# Patient Record
Sex: Male | Born: 1935 | Race: White | Hispanic: No | Marital: Married | Smoking: Former smoker
Health system: Southern US, Community
[De-identification: ages and names within clinical notes are randomized; demographics above are authoritative.]

## PROBLEM LIST (undated history)

## (undated) DIAGNOSIS — R55 Syncope and collapse: Secondary | ICD-10-CM

## (undated) DIAGNOSIS — K219 Gastro-esophageal reflux disease without esophagitis: Secondary | ICD-10-CM

## (undated) DIAGNOSIS — N189 Chronic kidney disease, unspecified: Secondary | ICD-10-CM

## (undated) DIAGNOSIS — E119 Type 2 diabetes mellitus without complications: Secondary | ICD-10-CM

## (undated) DIAGNOSIS — I739 Peripheral vascular disease, unspecified: Secondary | ICD-10-CM

## (undated) DIAGNOSIS — R001 Bradycardia, unspecified: Secondary | ICD-10-CM

## (undated) DIAGNOSIS — G4733 Obstructive sleep apnea (adult) (pediatric): Secondary | ICD-10-CM

## (undated) DIAGNOSIS — I10 Essential (primary) hypertension: Secondary | ICD-10-CM

## (undated) DIAGNOSIS — I872 Venous insufficiency (chronic) (peripheral): Secondary | ICD-10-CM

## (undated) DIAGNOSIS — E785 Hyperlipidemia, unspecified: Secondary | ICD-10-CM

## (undated) DIAGNOSIS — D759 Disease of blood and blood-forming organs, unspecified: Secondary | ICD-10-CM

## (undated) HISTORY — DX: Peripheral vascular disease, unspecified: I73.9

## (undated) HISTORY — DX: Venous insufficiency (chronic) (peripheral): I87.2

## (undated) HISTORY — DX: Type 2 diabetes mellitus without complications: E11.9

## (undated) HISTORY — PX: SEPTOPLASTY: SUR1290

---
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
## 2001-01-08 ENCOUNTER — Encounter: Admission: RE | Admit: 2001-01-08 | Discharge: 2001-04-08 | Payer: Self-pay | Admitting: Family Medicine

## 2001-03-06 ENCOUNTER — Ambulatory Visit (HOSPITAL_COMMUNITY): Admission: RE | Admit: 2001-03-06 | Discharge: 2001-03-06 | Payer: Self-pay | Admitting: *Deleted

## 2004-09-04 ENCOUNTER — Encounter: Admission: RE | Admit: 2004-09-04 | Discharge: 2004-12-03 | Payer: Self-pay | Admitting: Family Medicine

## 2010-09-18 ENCOUNTER — Ambulatory Visit: Payer: Self-pay | Admitting: Hematology & Oncology

## 2010-10-10 ENCOUNTER — Other Ambulatory Visit: Payer: Self-pay | Admitting: Hematology & Oncology

## 2010-10-10 ENCOUNTER — Ambulatory Visit (HOSPITAL_BASED_OUTPATIENT_CLINIC_OR_DEPARTMENT_OTHER): Payer: Medicare Other | Admitting: Hematology & Oncology

## 2010-10-10 DIAGNOSIS — D6949 Other primary thrombocytopenia: Secondary | ICD-10-CM

## 2010-10-10 LAB — CBC WITH DIFFERENTIAL (CANCER CENTER ONLY)
BASO#: 0 10*3/uL (ref 0.0–0.2)
BASO%: 0.3 % (ref 0.0–2.0)
EOS%: 6.4 % (ref 0.0–7.0)
HCT: 41.6 % (ref 38.7–49.9)
HGB: 14.5 g/dL (ref 13.0–17.1)
LYMPH#: 1.1 10*3/uL (ref 0.9–3.3)
MCH: 30.1 pg (ref 28.0–33.4)
MCHC: 34.9 g/dL (ref 32.0–35.9)
MONO%: 6.9 % (ref 0.0–13.0)
NEUT#: 4.3 10*3/uL (ref 1.5–6.5)
NEUT%: 68.5 % (ref 40.0–80.0)
RDW: 13.2 % (ref 11.1–15.7)

## 2010-10-10 LAB — CHCC SATELLITE - SMEAR

## 2010-10-12 LAB — PROTEIN ELECTROPHORESIS, SERUM
Albumin ELP: 58.9 % (ref 55.8–66.1)
Alpha-1-Globulin: 4.2 % (ref 2.9–4.9)
Alpha-2-Globulin: 12.4 % — ABNORMAL HIGH (ref 7.1–11.8)
Beta 2: 5.3 % (ref 3.2–6.5)
Beta Globulin: 6.4 % (ref 4.7–7.2)
Total Protein, Serum Electrophoresis: 7.1 g/dL (ref 6.0–8.3)

## 2010-10-12 LAB — LACTATE DEHYDROGENASE: LDH: 182 U/L (ref 94–250)

## 2010-10-12 LAB — RETICULOCYTES (CHCC): Retic Ct Pct: 1 % (ref 0.4–2.3)

## 2011-04-05 ENCOUNTER — Ambulatory Visit (HOSPITAL_BASED_OUTPATIENT_CLINIC_OR_DEPARTMENT_OTHER): Payer: Medicare Other | Admitting: Hematology & Oncology

## 2011-04-05 ENCOUNTER — Other Ambulatory Visit (HOSPITAL_BASED_OUTPATIENT_CLINIC_OR_DEPARTMENT_OTHER): Payer: Medicare Other | Admitting: Lab

## 2011-04-05 VITALS — BP 141/67 | HR 52 | Temp 96.5°F | Ht 70.5 in | Wt 169.0 lb

## 2011-04-05 DIAGNOSIS — D696 Thrombocytopenia, unspecified: Secondary | ICD-10-CM

## 2011-04-05 DIAGNOSIS — D693 Immune thrombocytopenic purpura: Secondary | ICD-10-CM

## 2011-04-05 LAB — CBC WITH DIFFERENTIAL (CANCER CENTER ONLY)
BASO%: 0.5 % (ref 0.0–2.0)
LYMPH%: 25.9 % (ref 14.0–48.0)
MCH: 29.7 pg (ref 28.0–33.4)
MCV: 88 fL (ref 82–98)
MONO#: 0.7 10*3/uL (ref 0.1–0.9)
MONO%: 11.9 % (ref 0.0–13.0)
Platelets: 111 10*3/uL — ABNORMAL LOW (ref 145–400)
RDW: 13.2 % (ref 11.1–15.7)
WBC: 6.1 10*3/uL (ref 4.0–10.0)

## 2011-04-05 LAB — CHCC SATELLITE - SMEAR

## 2011-04-05 NOTE — Progress Notes (Signed)
CC:   Anna Genre. Little, M.D.  DIAGNOSIS:  Thrombocytopenia, chronic, benign.  CURRENT THERAPY:  Observation.  INTERIM HISTORY:  Mr. Venard comes in for followup.  We saw him back in September 2012.  At that point in time, his workup was negative for the thrombocytopenia.  I have not seen him since 2004 in reality.  His platelet count has slowly trending downward.  However, he has been totally asymptomatic.  He has had no problem with bleeding or bruising.  There is no change in bowel or bladder habits.  He has had no weight loss or weight gain. There have been no rashes.  He has had no cough.  PHYSICAL EXAMINATION:  General:  This is an elderly but well-nourished white gentleman in no obvious distress.  Vital signs:  96.5, pulse 62, respiratory rate 18, blood pressure 141/67, weight is 169.  Head and neck:  Exam shows a normocephalic, atraumatic skull.  There are no ocular or oral lesions.  There are no palpable cervical or supraclavicular lymph nodes.  Lungs:  Are clear bilaterally.  Cardiac: Regular rate and rhythm with a normal S1, S2.  There are no murmurs, rubs or bruits.  Abdomen:  Soft with good bowel sounds.  There is no palpable abdominal mass.  There is no palpable hepatosplenomegaly. Back:  No tenderness over the spine, ribs or hips.  Extremities:  Shows no clubbing, cyanosis or edema.  Neurological:  Exam shows no focal neurological deficits.  Skin:  No rashes, ecchymoses or petechiae.  LABORATORY STUDIES:  White cell count is 6.1, hemoglobin 13.6, hematocrit 40.2, platelet count 111.  MCV is 88.  Peripheral smear shows mildly decreased platelets.  Platelets are well- granulated.  He has several large platelets.  There were no nucleated red blood cells.  There were no teardrop cells.  I see no hypersegmented polys.  IMPRESSION:  Mr. Tanguma is a 76 year old gentleman with chronic thrombocytopenia.  His platelet count is really stable over the 6 months.  Will go  ahead and plan to get him back in 6 more months.  I do not see a need for a bone marrow test.  I do not see a need for any change in medications.  He does not need any blood work in-between visits.    ______________________________ Josph Macho, M.D. PRE/MEDQ  D:  04/05/2011  T:  04/05/2011  Job:  [id number]

## 2011-04-05 NOTE — Progress Notes (Signed)
This office note has been dictated.

## 2011-10-18 ENCOUNTER — Ambulatory Visit (HOSPITAL_BASED_OUTPATIENT_CLINIC_OR_DEPARTMENT_OTHER): Payer: Medicare Other | Admitting: Hematology & Oncology

## 2011-10-18 ENCOUNTER — Other Ambulatory Visit (HOSPITAL_BASED_OUTPATIENT_CLINIC_OR_DEPARTMENT_OTHER): Payer: Medicare Other | Admitting: Lab

## 2011-10-18 VITALS — BP 155/59 | HR 52 | Temp 97.6°F | Resp 20 | Ht 70.0 in | Wt 168.0 lb

## 2011-10-18 DIAGNOSIS — D696 Thrombocytopenia, unspecified: Secondary | ICD-10-CM

## 2011-10-18 DIAGNOSIS — D693 Immune thrombocytopenic purpura: Secondary | ICD-10-CM

## 2011-10-18 LAB — CBC WITH DIFFERENTIAL (CANCER CENTER ONLY)
BASO#: 0 10*3/uL (ref 0.0–0.2)
Eosinophils Absolute: 0.4 10*3/uL (ref 0.0–0.5)
HCT: 42.1 % (ref 38.7–49.9)
LYMPH%: 21.8 % (ref 14.0–48.0)
MCV: 88 fL (ref 82–98)
MONO#: 0.5 10*3/uL (ref 0.1–0.9)
NEUT%: 64.6 % (ref 40.0–80.0)
RBC: 4.8 10*6/uL (ref 4.20–5.70)
RDW: 13.2 % (ref 11.1–15.7)
WBC: 6.8 10*3/uL (ref 4.0–10.0)

## 2011-10-18 NOTE — Progress Notes (Signed)
This office note has been dictated.

## 2011-10-18 NOTE — Patient Instructions (Signed)
Call if bleeding

## 2011-10-18 NOTE — Progress Notes (Signed)
CC:   Anna Genre. Little, M.D.  DIAGNOSIS:  Chronic thrombocytopenia, benign.  CURRENT THERAPY:  Observation.  INTERIM HISTORY:  Mr. Mcgrane comes in for followup.  He is doing quite well.  We last saw him back in March.  Since then, he has had no problems at all.  He and his wife just got back from the [employer]. They had a camper they brought with them.  Mr. Philip has not had any problems with bleeding or bruising.  He has had no weight loss or weight gain.  He has had no change in his medications.  He has had no leg swelling.  There have been no rashes.  When we last saw him, his platelet count is 111,000.  PHYSICAL EXAMINATION:  This is an elderly but well-nourished white gentleman in no obvious distress.  Vital signs: 97.6, pulse 52, respiratory rate 20, blood pressure 155/59.  Weight is 168.  Head and neck:  Normocephalic, atraumatic skull.  There are no ocular or oral lesions.  There are no palpable cervical or supraclavicular lymph nodes. Lungs:  Clear bilaterally.  Cardiac:  Regular rate and rhythm with a normal S1, S2.  There are no murmurs, rubs or bruits.  Abdomen:  Soft with good bowel sounds.  There is no palpable abdominal mass.  There is no palpable hepatosplenomegaly  Back:  No tenderness over the spine, ribs, or hips.  Extremities:  No clubbing, cyanosis or edema. Neurological:  No focal neurological deficits.  LABORATORY STUDIES:  White cell count 6.8, hemoglobin 14.2, hematocrit 42.1, platelet count 135.  IMPRESSION:  Mr. Lanius is a 76 year old gentleman with chronic thrombocytopenia.  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  I have been seeing him on and off since 2004.  His platelet count was too low at that point in time.  Again, I do not see that this thrombocytopenia is going to cause a problem for Mr. Lerro.  He always have low platelets  but I do not think that they will ever be an issue for him.  We will go ahead and plan to let him go from the clinic for now.  We will be more than happy to see him back as needed according to Dr. Clarene Duke.  I would just check his platelets every 6 months.  As always, it is a privilege to have seen Mr. Tyrone.  We had good fellowship.    ______________________________ Josph Macho, M.D. PRE/MEDQ  D:  10/18/2011  T:  10/18/2011  Job:  [id number]

## 2015-03-30 ENCOUNTER — Other Ambulatory Visit: Payer: Self-pay | Admitting: Orthopaedic Surgery

## 2015-04-24 ENCOUNTER — Encounter (HOSPITAL_BASED_OUTPATIENT_CLINIC_OR_DEPARTMENT_OTHER): Payer: Self-pay | Admitting: *Deleted

## 2015-04-26 NOTE — H&P (Signed)
Kevin [name] is an 80 y.o. male.   Chief Complaint: left hand pain HPI: Kevin [name] continues with numbness involving the left hand. He will have symptoms sometimes even while wearing the brace. This wakes him from sleep. He shakes his hands in the morning to get the sensation back despite wearing his brace. 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 He has been through a nerve conduction study since last time he was in.   Data: I reviewed a nerve conduction study done here by Dr Mina Marble on 03/08/15. This shows severe carpal tunnel syndrome on the left.  Past Medical History  Diagnosis Date  . Hypertension   . Diabetes mellitus without complication ([place])   . GERD (gastroesophageal reflux disease)   . Blood dyscrasia     chronic thrombocytopenia    Past Surgical History  Procedure Laterality Date  . Septoplasty      History reviewed. No pertinent family history. Social History:  reports that he has quit smoking. He does not have any smokeless tobacco history on file. He reports that he does not drink alcohol or use illicit drugs.  Allergies: No Known Allergies  No prescriptions prior to admission    No results found for this or any previous visit (from the past 48 hour(s)). No results found.  Review of Systems  Musculoskeletal: Positive for joint pain.       Left hand  All other systems reviewed and are negative.   Height 5\' 10"  (1.778 m), weight 79.379 kg (175 lb). Physical Exam  Constitutional: He is oriented to person, place, and time. He appears well-developed and well-nourished.  HENT:  Head: Normocephalic and atraumatic.  Eyes: Pupils are equal, round, and reactive to light.  Neck: Normal range of motion.  Cardiovascular: Normal rate and regular rhythm.   Respiratory: Effort normal.  GI: Soft.  Musculoskeletal:  He still does have some mild thenar atrophy on the left. He fails two point discrimination at 10 millimeters in the median distribution on this side. Tinel is  positive over the carpal tunnel. His Phalen test is positive at 10 seconds. Shoulder motion is full and he has good cervical motion. He has good refill to the tips of his fingers.   Neurological: He is alert and oriented to person, place, and time.  Skin: Skin is warm and dry.  Psychiatric: He has a normal mood and affect. His behavior is normal. Judgment and thought content normal.     Assessment/Plan Assessment: Left severe carpal tunnel syndrome by nerve conduction study injected 03/26/14  Plan: Kevin [name] has significant difficulty with this hand due to severe carpal tunnel syndrome. We've been bracing him for about a year and he had an injection which did help transiently. Based on his nerve conduction study the compression on the nerve at the wrist is severe. I think his best option is surgical at this point. I reviewed risk of anesthesia, infection, neurovascular injury related to a carpal tunnel release. I think we can take care of this with a forearm level Bier block and some sedation. I told him he will be in a brace for about a week at which point he will have his sutures removed and a Band-Aid would be applied.  Jerilee Space, Larwance Sachs, PA-C 04/26/2015, 12:23 PM

## 2015-04-27 ENCOUNTER — Encounter (HOSPITAL_BASED_OUTPATIENT_CLINIC_OR_DEPARTMENT_OTHER)
Admission: RE | Admit: 2015-04-27 | Discharge: 2015-04-27 | Disposition: A | Discharge status: Home / Self Care | Payer: Medicare Other | Source: Ambulatory Visit | Attending: Orthopaedic Surgery | Admitting: Orthopaedic Surgery

## 2015-04-27 DIAGNOSIS — Z01810 Encounter for preprocedural cardiovascular examination: Secondary | ICD-10-CM | POA: Diagnosis present

## 2015-04-27 DIAGNOSIS — E119 Type 2 diabetes mellitus without complications: Secondary | ICD-10-CM | POA: Insufficient documentation

## 2015-04-27 DIAGNOSIS — I10 Essential (primary) hypertension: Secondary | ICD-10-CM | POA: Insufficient documentation

## 2015-04-27 DIAGNOSIS — Z01812 Encounter for preprocedural laboratory examination: Secondary | ICD-10-CM | POA: Diagnosis present

## 2015-04-27 DIAGNOSIS — E1122 Type 2 diabetes mellitus with diabetic chronic kidney disease: Secondary | ICD-10-CM | POA: Diagnosis not present

## 2015-04-27 DIAGNOSIS — Z87891 Personal history of nicotine dependence: Secondary | ICD-10-CM | POA: Diagnosis not present

## 2015-04-27 DIAGNOSIS — Z7984 Long term (current) use of oral hypoglycemic drugs: Secondary | ICD-10-CM | POA: Diagnosis not present

## 2015-04-27 DIAGNOSIS — G5602 Carpal tunnel syndrome, left upper limb: Secondary | ICD-10-CM | POA: Diagnosis not present

## 2015-04-27 LAB — BASIC METABOLIC PANEL
Anion gap: 9 (ref 5–15)
BUN: 16 mg/dL (ref 6–20)
CALCIUM: 9.5 mg/dL (ref 8.9–10.3)
CO2: 26 mmol/L (ref 22–32)
CREATININE: 0.99 mg/dL (ref 0.61–1.24)
Chloride: 107 mmol/L (ref 101–111)
Glucose, Bld: 218 mg/dL — ABNORMAL HIGH (ref 65–99)
Potassium: 4 mmol/L (ref 3.5–5.1)
SODIUM: 142 mmol/L (ref 135–145)

## 2015-04-28 ENCOUNTER — Ambulatory Visit (HOSPITAL_BASED_OUTPATIENT_CLINIC_OR_DEPARTMENT_OTHER)
Admission: RE | Admit: 2015-04-28 | Discharge: 2015-04-28 | Disposition: A | Discharge status: Home / Self Care | Payer: Medicare Other | Source: Ambulatory Visit | Attending: Orthopaedic Surgery | Admitting: Orthopaedic Surgery

## 2015-04-28 ENCOUNTER — Encounter (HOSPITAL_BASED_OUTPATIENT_CLINIC_OR_DEPARTMENT_OTHER): Payer: Self-pay | Admitting: Anesthesiology

## 2015-04-28 ENCOUNTER — Ambulatory Visit (HOSPITAL_BASED_OUTPATIENT_CLINIC_OR_DEPARTMENT_OTHER): Payer: Medicare Other | Admitting: Anesthesiology

## 2015-04-28 ENCOUNTER — Encounter (HOSPITAL_BASED_OUTPATIENT_CLINIC_OR_DEPARTMENT_OTHER)
Admission: RE | Disposition: A | Discharge status: Home / Self Care | Payer: Self-pay | Source: Ambulatory Visit | Attending: Orthopaedic Surgery

## 2015-04-28 DIAGNOSIS — Z7984 Long term (current) use of oral hypoglycemic drugs: Secondary | ICD-10-CM | POA: Diagnosis not present

## 2015-04-28 DIAGNOSIS — Z87891 Personal history of nicotine dependence: Secondary | ICD-10-CM | POA: Insufficient documentation

## 2015-04-28 DIAGNOSIS — I10 Essential (primary) hypertension: Secondary | ICD-10-CM | POA: Insufficient documentation

## 2015-04-28 DIAGNOSIS — E1122 Type 2 diabetes mellitus with diabetic chronic kidney disease: Secondary | ICD-10-CM | POA: Insufficient documentation

## 2015-04-28 DIAGNOSIS — G5602 Carpal tunnel syndrome, left upper limb: Secondary | ICD-10-CM | POA: Insufficient documentation

## 2015-04-28 HISTORY — DX: Type 2 diabetes mellitus without complications: E11.9

## 2015-04-28 HISTORY — DX: Gastro-esophageal reflux disease without esophagitis: K21.9

## 2015-04-28 HISTORY — DX: Disease of blood and blood-forming organs, unspecified: D75.9

## 2015-04-28 HISTORY — DX: Essential (primary) hypertension: I10

## 2015-04-28 HISTORY — PX: CARPAL TUNNEL RELEASE: SHX101

## 2015-04-28 LAB — GLUCOSE, CAPILLARY: Glucose-Capillary: 81 mg/dL (ref 65–99)

## 2015-04-28 SURGERY — CARPAL TUNNEL RELEASE
Anesthesia: Monitor Anesthesia Care | Site: Wrist | Laterality: Left

## 2015-04-28 MED ORDER — CEFAZOLIN SODIUM-DEXTROSE 2-3 GM-% IV SOLR
INTRAVENOUS | Status: DC | PRN
  Administered 2015-04-28: 2 g via INTRAVENOUS

## 2015-04-28 MED ORDER — GLYCOPYRROLATE 0.2 MG/ML IJ SOLN: 0.2000 mg | Freq: Once | INTRAMUSCULAR | Status: DC | PRN

## 2015-04-28 MED ORDER — HYDROCODONE-ACETAMINOPHEN 5-325 MG PO TABS: 1.0000 | ORAL_TABLET | ORAL | Status: DC | PRN

## 2015-04-28 MED ORDER — LACTATED RINGERS IV SOLN: INTRAVENOUS | Status: DC

## 2015-04-28 MED ORDER — ONDANSETRON HCL 4 MG/2ML IJ SOLN
INTRAMUSCULAR | Status: DC | PRN
  Administered 2015-04-28: 4 mg via INTRAVENOUS

## 2015-04-28 MED ORDER — FENTANYL CITRATE (PF) 100 MCG/2ML IJ SOLN
INTRAMUSCULAR | Status: AC
  Filled 2015-04-28: qty 2

## 2015-04-28 MED ORDER — CEFAZOLIN SODIUM-DEXTROSE 2-4 GM/100ML-% IV SOLN
INTRAVENOUS | Status: AC
  Filled 2015-04-28: qty 100

## 2015-04-28 MED ORDER — LACTATED RINGERS IV SOLN
INTRAVENOUS | Status: DC
  Administered 2015-04-28: 10 mL/h via INTRAVENOUS

## 2015-04-28 MED ORDER — DEXTROSE 5 % IV SOLN: 2.0000 g | INTRAVENOUS | Status: DC

## 2015-04-28 MED ORDER — ONDANSETRON HCL 4 MG/2ML IJ SOLN
INTRAMUSCULAR | Status: AC
  Filled 2015-04-28: qty 2

## 2015-04-28 MED ORDER — LIDOCAINE HCL (PF) 0.5 % IJ SOLN
INTRAMUSCULAR | Status: DC | PRN
  Administered 2015-04-28: 30 mL via INTRAVENOUS

## 2015-04-28 MED ORDER — SCOPOLAMINE 1 MG/3DAYS TD PT72: 1.0000 | MEDICATED_PATCH | Freq: Once | TRANSDERMAL | Status: DC | PRN

## 2015-04-28 MED ORDER — FENTANYL CITRATE (PF) 100 MCG/2ML IJ SOLN
50.0000 ug | INTRAMUSCULAR | Status: DC | PRN
  Administered 2015-04-28: 50 ug via INTRAVENOUS

## 2015-04-28 MED ORDER — MIDAZOLAM HCL 2 MG/2ML IJ SOLN
1.0000 mg | INTRAMUSCULAR | Status: DC | PRN
  Administered 2015-04-28: 2 mg via INTRAVENOUS

## 2015-04-28 MED ORDER — CHLORHEXIDINE GLUCONATE 4 % EX LIQD: 60.0000 mL | Freq: Once | CUTANEOUS | Status: DC

## 2015-04-28 MED ORDER — MIDAZOLAM HCL 2 MG/2ML IJ SOLN
INTRAMUSCULAR | Status: AC
  Filled 2015-04-28: qty 2

## 2015-04-28 MED ORDER — BUPIVACAINE-EPINEPHRINE 0.25% -1:200000 IJ SOLN
INTRAMUSCULAR | Status: DC | PRN
  Administered 2015-04-28: 5 mL

## 2015-04-28 MED ORDER — FENTANYL CITRATE (PF) 100 MCG/2ML IJ SOLN: 25.0000 ug | INTRAMUSCULAR | Status: DC | PRN

## 2015-04-28 SURGICAL SUPPLY — 50 items
BANDAGE ACE 4X5 VEL STRL LF (GAUZE/BANDAGES/DRESSINGS) ×3 IMPLANT
BLADE MINI RND TIP GREEN BEAV (BLADE) ×3 IMPLANT
BLADE SURG 15 STRL LF DISP TIS (BLADE) ×1 IMPLANT
BLADE SURG 15 STRL SS (BLADE) ×3
BNDG CMPR 9X4 STRL LF SNTH (GAUZE/BANDAGES/DRESSINGS) ×1
BNDG ESMARK 4X9 LF (GAUZE/BANDAGES/DRESSINGS) ×2 IMPLANT
BNDG GAUZE ELAST 4 BULKY (GAUZE/BANDAGES/DRESSINGS) ×3 IMPLANT
BNDG PLASTER X FAST 3X3 WHT LF (CAST SUPPLIES) ×30 IMPLANT
BNDG PLSTR 9X3 FST ST WHT (CAST SUPPLIES) ×10
COVER BACK TABLE 60X90IN (DRAPES) ×3 IMPLANT
COVER MAYO STAND STRL (DRAPES) ×3 IMPLANT
CUFF TOURNIQUET SINGLE 18IN (TOURNIQUET CUFF) ×2 IMPLANT
DRAPE EXTREMITY T 121X128X90 (DRAPE) ×3 IMPLANT
DRAPE SURG 17X23 STRL (DRAPES) ×3 IMPLANT
DRSG EMULSION OIL 3X3 NADH (GAUZE/BANDAGES/DRESSINGS) ×3 IMPLANT
DRSG PAD ABDOMINAL 8X10 ST (GAUZE/BANDAGES/DRESSINGS) IMPLANT
DURAPREP 26ML APPLICATOR (WOUND CARE) ×3 IMPLANT
ELECT NDL TIP 2.8 STRL (NEEDLE) IMPLANT
ELECT NEEDLE TIP 2.8 STRL (NEEDLE) IMPLANT
ELECT REM PT RETURN 9FT ADLT (ELECTROSURGICAL) ×3
ELECTRODE REM PT RTRN 9FT ADLT (ELECTROSURGICAL) ×1 IMPLANT
GAUZE SPONGE 4X4 12PLY STRL (GAUZE/BANDAGES/DRESSINGS) ×3 IMPLANT
GLOVE BIO SURGEON STRL SZ8 (GLOVE) ×6 IMPLANT
GLOVE BIOGEL PI IND STRL 8 (GLOVE) ×2 IMPLANT
GLOVE BIOGEL PI INDICATOR 8 (GLOVE) ×4
GOWN STRL REUS W/ TWL LRG LVL3 (GOWN DISPOSABLE) ×1 IMPLANT
GOWN STRL REUS W/ TWL XL LVL3 (GOWN DISPOSABLE) ×2 IMPLANT
GOWN STRL REUS W/TWL LRG LVL3 (GOWN DISPOSABLE) ×3
GOWN STRL REUS W/TWL XL LVL3 (GOWN DISPOSABLE) ×6
LOOP VESSEL MAXI BLUE (MISCELLANEOUS) IMPLANT
NDL HYPO 25X1 1.5 SAFETY (NEEDLE) IMPLANT
NEEDLE HYPO 25X1 1.5 SAFETY (NEEDLE) ×3 IMPLANT
NS IRRIG 1000ML POUR BTL (IV SOLUTION) ×3 IMPLANT
PACK BASIN DAY SURGERY FS (CUSTOM PROCEDURE TRAY) ×3 IMPLANT
PAD CAST 4YDX4 CTTN HI CHSV (CAST SUPPLIES) ×1 IMPLANT
PADDING CAST ABS 4INX4YD NS (CAST SUPPLIES)
PADDING CAST ABS COTTON 4X4 ST (CAST SUPPLIES) IMPLANT
PADDING CAST COTTON 4X4 STRL (CAST SUPPLIES) ×3
PENCIL BUTTON HOLSTER BLD 10FT (ELECTRODE) ×2 IMPLANT
SPLINT PLASTER CAST XFAST 3X15 (CAST SUPPLIES) ×5 IMPLANT
SPLINT PLASTER XTRA FASTSET 3X (CAST SUPPLIES) ×10
STOCKINETTE 4X48 STRL (DRAPES) ×3 IMPLANT
SUT ETHILON 4 0 PS 2 18 (SUTURE) ×3 IMPLANT
SUT VIC AB 4-0 P-3 18XBRD (SUTURE) IMPLANT
SUT VIC AB 4-0 P3 18 (SUTURE)
SYR BULB 3OZ (MISCELLANEOUS) ×2 IMPLANT
SYR CONTROL 10ML LL (SYRINGE) IMPLANT
TOWEL OR 17X24 6PK STRL BLUE (TOWEL DISPOSABLE) ×3 IMPLANT
TOWEL OR NON WOVEN STRL DISP B (DISPOSABLE) ×3 IMPLANT
UNDERPAD 30X30 (UNDERPADS AND DIAPERS) ×3 IMPLANT

## 2015-04-28 NOTE — Discharge Instructions (Signed)

## 2015-04-28 NOTE — Op Note (Signed)
,  PRE-OP DIAGNOSIS:  left carpal tunnel syndrome POST-OP DIAGNOSIS:  same PROCEDURE:  left carpal tunnel release ANESTHESIA:  Bier block and MAC ATTENDING SURGEON:  Melrose Nakayama MD ASSISTANT:  Loni Dolly PA  INDICATION FOR PROCEDURE:  Kevin [name] is a 80 y.o. male with a long history of hand pain and numbness.  The patient has failed conservative measures of treatment and persists with symptoms which are very limiting.  The patient is offered carpal tunnel release in hopes of improving their situation by relieving pressure on the medial nerve.  Informed operative consent was obtained after discussion of possible complications including reaction to anesthesia, infection, and neurovascular injury.  SUMMARY OF FINDINGS AND PROCEDURE:  Under to above anesthetic a carpal tunnel release was performed.  The patient had a severely thickened transverse carpal tunnel ligament which was applying compression to the underlying medial nerve.  This was release under direct visualization.  The skin was closed primarily followed by placement of a sterile dressing and splint with the wrist in slight extension.  DESCRIPTION OF PROCEDURE:  NORLAN KAPRAL was taken to an operative suite where the above anesthetic was applied.  The patient was then prepped and draped in normal sterile fashion.  An appropriate time out was performed.  After the administration of kefzol pre-operative antibiotic and application and inflation of a tourniquet a small incision was made on the palmar aspect of the palm.  This did not cross the wrist flexion crease and was ulnar to the thenar flexion crease.  Dissection was carried down through palmar fascia to expose the transverse carpal ligament which was released under direct visualization.  The dissection was taken distally towards the transverse arch of vessels and distally through the distal fascia of the forearm.  The wound was irrigated and the skin was closed with nylon.  The  tourniquet was deflated and skin edges became pink immediately and pulses were intact.  Adaptic was applied along with a sterile dressing and a plaster splint with the wrist in slight extension.  Estimated blood loss and intraoperative fluids can be obtained from anesthesia records as can tourniquet time.  DISPOSITION:  CRISTAL KINZER was taken to recovery room in stable condition.  Plans were to go home same day and I will contact the patient by phone tonight.  The patient will follow-up in about a week in the office.  Bani Gianfrancesco G 04/28/2015, 1:36 PM

## 2015-04-28 NOTE — Interval H&P Note (Signed)
OK for surgery PD 

## 2015-04-28 NOTE — Anesthesia Preprocedure Evaluation (Addendum)
Anesthesia Evaluation  Patient identified by MRN, date of birth, ID band Patient awake    Reviewed: Allergy & Precautions, NPO status , Patient's Chart, lab work & pertinent test results, reviewed documented beta blocker date and time   Airway Mallampati: I  TM Distance: >3 FB Neck ROM: Full    Dental  (+) Edentulous Upper, Edentulous Lower   Pulmonary former smoker,    breath sounds clear to auscultation       Cardiovascular hypertension, Pt. on medications and Pt. on home beta blockers  Rhythm:Regular Rate:Normal     Neuro/Psych negative neurological ROS  negative psych ROS   GI/Hepatic Neg liver ROS, GERD  ,  Endo/Other  diabetes, Type 2, Oral Hypoglycemic Agents  Renal/GU negative Renal ROS  negative genitourinary   Musculoskeletal negative musculoskeletal ROS (+)   Abdominal   Peds negative pediatric ROS (+)  Hematology   Anesthesia Other Findings   Reproductive/Obstetrics negative OB ROS                            Lab Results  Component Value Date   WBC 6.8 10/18/2011   HGB 14.2 10/18/2011   HCT 42.1 10/18/2011   MCV 88 10/18/2011   PLT 135* 10/18/2011   No results found for: INR, PROTIME  04/2015 EKG: normal sinus rhythm, PAC's noted, 1st degree AV block.   Anesthesia Physical Anesthesia Plan  ASA: II  Anesthesia Plan: Bier Block   Post-op Pain Management:    Induction: Intravenous  Airway Management Planned: Natural Airway and Simple Face Mask  Additional Equipment:   Intra-op Plan:   Post-operative Plan:   Informed Consent: I have reviewed the patients History and Physical, chart, labs and discussed the procedure including the risks, benefits and alternatives for the proposed anesthesia with the patient or authorized representative who has indicated his/her understanding and acceptance.     Plan Discussed with: CRNA  Anesthesia Plan Comments:          Anesthesia Quick Evaluation

## 2015-04-28 NOTE — Anesthesia Postprocedure Evaluation (Signed)
Anesthesia Post Note  Patient: Kevin [name]  Procedure(s) Performed: Procedure(s) (LRB): LEFT WRIST CARPAL TUNNEL RELEASE (Left)  Patient location during evaluation: PACU Anesthesia Type: MAC and Bier Block Level of consciousness: awake and alert Pain management: pain level controlled Vital Signs Assessment: post-procedure vital signs reviewed and stable Respiratory status: spontaneous breathing, nonlabored ventilation, respiratory function stable and patient connected to nasal cannula oxygen Cardiovascular status: stable and blood pressure returned to baseline Anesthetic complications: no    Last Vitals:  Filed Vitals:   04/28/15 1415 04/28/15 1430  BP: 118/59 133/62  Pulse: 44   Temp:    Resp: 14 15    Last Pain:  Filed Vitals:   04/28/15 1438  PainSc: 0-No pain                 Effie Berkshire

## 2015-04-28 NOTE — Transfer of Care (Signed)
Immediate Anesthesia Transfer of Care Note  Patient: Kevin [name]  Procedure(s) Performed: Procedure(s): LEFT WRIST CARPAL TUNNEL RELEASE (Left)  Patient Location: PACU  Anesthesia Type:MAC and Bier block  Level of Consciousness: awake, alert  and oriented  Airway & Oxygen Therapy: Patient Spontanous Breathing and Patient connected to face mask oxygen  Post-op Assessment: Report given to RN, Post -op Vital signs reviewed and stable and Patient moving all extremities  Post vital signs: Reviewed and stable  Last Vitals:  Filed Vitals:   04/28/15 1140  BP: 139/62  Pulse: 51  Temp: 36.5 C  Resp: 16    Complications: No apparent anesthesia complications

## 2015-05-01 ENCOUNTER — Encounter (HOSPITAL_BASED_OUTPATIENT_CLINIC_OR_DEPARTMENT_OTHER): Payer: Self-pay | Admitting: Orthopaedic Surgery

## 2016-04-15 ENCOUNTER — Other Ambulatory Visit: Payer: Self-pay | Admitting: Vascular Surgery

## 2016-04-15 DIAGNOSIS — I739 Peripheral vascular disease, unspecified: Secondary | ICD-10-CM

## 2016-06-03 ENCOUNTER — Encounter: Payer: Self-pay | Admitting: Vascular Surgery

## 2016-06-07 ENCOUNTER — Ambulatory Visit (INDEPENDENT_AMBULATORY_CARE_PROVIDER_SITE_OTHER)
Admission: RE | Admit: 2016-06-07 | Discharge: 2016-06-07 | Disposition: A | Discharge status: Home / Self Care | Payer: Medicare Other | Source: Ambulatory Visit | Attending: Vascular Surgery | Admitting: Vascular Surgery

## 2016-06-07 ENCOUNTER — Encounter: Payer: Self-pay | Admitting: Vascular Surgery

## 2016-06-07 ENCOUNTER — Ambulatory Visit (INDEPENDENT_AMBULATORY_CARE_PROVIDER_SITE_OTHER): Payer: Medicare Other | Admitting: Vascular Surgery

## 2016-06-07 ENCOUNTER — Ambulatory Visit (HOSPITAL_COMMUNITY)
Admission: RE | Admit: 2016-06-07 | Discharge: 2016-06-07 | Disposition: A | Discharge status: Home / Self Care | Payer: Medicare Other | Source: Ambulatory Visit | Attending: Vascular Surgery | Admitting: Vascular Surgery

## 2016-06-07 VITALS — BP 160/76 | HR 50 | Temp 97.0°F | Resp 16 | Ht 69.0 in | Wt 170.0 lb

## 2016-06-07 DIAGNOSIS — I739 Peripheral vascular disease, unspecified: Secondary | ICD-10-CM | POA: Diagnosis not present

## 2016-06-07 DIAGNOSIS — I872 Venous insufficiency (chronic) (peripheral): Secondary | ICD-10-CM

## 2016-06-07 NOTE — Progress Notes (Signed)
[facility] [place]  Patient name: Kevin [name] MRN: [id number] DOB: 1935/11/10 Sex: male  REASON FOR VISIT: abnormal ABIs  HPI: Kevin [name] is a 81 y.o. male, who presents for evaluation of PAD following abnormal in-house ABIs by his insurance company. The patient denies any claudication, nonhealing wounds or rest pain. He is very active and is able to walk miles without difficulty. He is still working and is on his feet all day. He has a past medical history of hypertension and diabetes that are well managed. He denies any peripheral neuropathy. He is a former smoker, quitting over 40 years ago.  He has no prior history of CAD or chest pain. He has never had a CVA. He denies ever having shortness of breath.  He does have a history of chronic thrombocytopenia. He denies any leg swelling.  He takes a daily aspirin and statin.    Past Medical History:  Diagnosis Date  . Blood dyscrasia    chronic thrombocytopenia  . Diabetes mellitus without complication ([place])   . GERD (gastroesophageal reflux disease)   . Hypertension     No family history on file.  SOCIAL HISTORY: Social History   Social History  . Marital status: Married    Spouse name: N/A  . Number of children: N/A  . Years of education: N/A   Occupational History  . Not on file.   Social History Main Topics  . Smoking status: Former Smoker    Types: Cigarettes  . Smokeless tobacco: Former Systems developer  . Alcohol use No  . Drug use: No  . Sexual activity: Not on file   Other Topics Concern  . Not on file   Social History Narrative  . No narrative on file    No Known Allergies  Current Outpatient Prescriptions  Medication Sig Dispense Refill  . aspirin 81 MG tablet Take 81 mg by mouth daily.    Marland Kitchen atenolol (TENORMIN) 100 MG tablet Take 50 mg by mouth 2 (two) times daily.    Marland Kitchen atorvastatin (LIPITOR) 20 MG tablet Take 20 mg by mouth daily.    Marland Kitchen docusate sodium (COLACE) 100 MG capsule  Take 100 mg by mouth daily.    . hydrochlorothiazide (MICROZIDE) 12.5 MG capsule Take 12.5 mg by mouth daily.    Marland Kitchen latanoprost (XALATAN) 0.005 % ophthalmic solution Place 1 drop into both eyes at bedtime.    Marland Kitchen lisinopril (PRINIVIL,ZESTRIL) 30 MG tablet Take 30 mg by mouth 2 (two) times daily.    . metFORMIN (GLUCOPHAGE) 500 MG tablet Take 500 mg by mouth daily with breakfast.     . Multiple Vitamin (MULTIVITAMIN) capsule Take 1 capsule by mouth daily.    . psyllium (METAMUCIL) 58.6 % powder Take 1 packet by mouth 2 (two) times daily.    . ranitidine (ZANTAC) 150 MG tablet Take 150 mg by mouth 2 (two) times daily.    . vitamin E 200 UNIT capsule Take 200 Units by mouth daily.     No current facility-administered medications for this visit.     REVIEW OF SYSTEMS:  [X]  denotes positive finding, [ ]  denotes negative finding Cardiac  Comments:  Chest pain or chest pressure:    Shortness of breath upon exertion:    Short of breath when lying flat:    Irregular heart rhythm:        Vascular    Pain in calf, thigh, or hip brought on by ambulation:    Pain  in feet at night that wakes you up from your sleep:     Blood clot in your veins:    Leg swelling:         Pulmonary    Oxygen at home:    Productive cough:     Wheezing:         Neurologic    Sudden weakness in arms or legs:     Sudden numbness in arms or legs:     Sudden onset of difficulty speaking or slurred speech:    Temporary loss of vision in one eye:     Problems with dizziness:         Gastrointestinal    Blood in stool:     Vomited blood:         Genitourinary    Burning when urinating:     Blood in urine:        Psychiatric    Major depression:         Hematologic    Bleeding problems:    Problems with blood clotting too easily:        Skin    Rashes or ulcers:        Constitutional    Fever or chills:      PHYSICAL EXAM: Vitals:   06/07/16 1345  BP: (!) 160/76  Pulse: (!) 50  Resp: 16  Temp: 97  F (36.1 C)  TempSrc: Oral  SpO2: 95%  Weight: 170 lb (77.1 kg)  Height: 5\' 9"  (1.753 m)    GENERAL: The patient is a well-nourished male, in no acute distress. The vital signs are documented above. CARDIAC: There is a regular rate and rhythm. No carotid bruits. VASCULAR: 1+ right femoral pulse, 2+ left femoral pulse. Nonpalpable popliteal and pedal pulses. Good PT and DP Doppler signals bilaterally. PULMONARY: There is good air exchange bilaterally without wheezing or rales. ABDOMEN: Soft and non-tender. No pulsatile mass. MUSCULOSKELETAL: There are no major deformities or cyanosis. NEUROLOGIC: No focal weakness or paresthesias are detected. SKIN: Lipodermatosclerosis of the lower extremities bilaterally PSYCHIATRIC: The patient has a normal affect.  DATA:  ABIs 06/07/16 R: 0.95 with monophasic PT, biphasic DP, TBI: 0.33 L: [place], monophasic PT, biphasic DP, TBI: 0.78   MEDICAL ISSUES: Asymptomatic peripheral arterial disease C4 chronic venous insufficiency  The patient does have abnormal ABIs, however he denies any symptoms of claudication or nonhealing wounds. He is able to ambulate long distances without difficulty. He does have some evidence of venous insufficiency on physical exam. However, he is not having any symptoms associated with this. He will follow-up when necessary. Discussed continued use of aspirin and statin. He is a former smoker.   Virgina Jock, PA-C [facility] MD: Donzetta Matters

## 2018-11-03 ENCOUNTER — Encounter (HOSPITAL_COMMUNITY): Payer: Self-pay | Admitting: Emergency Medicine

## 2018-11-03 ENCOUNTER — Other Ambulatory Visit: Payer: Self-pay

## 2018-11-03 ENCOUNTER — Observation Stay (HOSPITAL_COMMUNITY)
Admission: EM | Admit: 2018-11-03 | Discharge: 2018-11-05 | Disposition: A | Discharge status: Home / Self Care | Payer: Medicare Other | Attending: Cardiology | Admitting: Cardiology

## 2018-11-03 DIAGNOSIS — R55 Syncope and collapse: Principal | ICD-10-CM | POA: Insufficient documentation

## 2018-11-03 DIAGNOSIS — Z20828 Contact with and (suspected) exposure to other viral communicable diseases: Secondary | ICD-10-CM | POA: Diagnosis not present

## 2018-11-03 DIAGNOSIS — E119 Type 2 diabetes mellitus without complications: Secondary | ICD-10-CM | POA: Diagnosis not present

## 2018-11-03 DIAGNOSIS — I10 Essential (primary) hypertension: Secondary | ICD-10-CM | POA: Diagnosis not present

## 2018-11-03 DIAGNOSIS — R42 Dizziness and giddiness: Secondary | ICD-10-CM | POA: Diagnosis present

## 2018-11-03 DIAGNOSIS — E785 Hyperlipidemia, unspecified: Secondary | ICD-10-CM | POA: Insufficient documentation

## 2018-11-03 DIAGNOSIS — R001 Bradycardia, unspecified: Secondary | ICD-10-CM | POA: Diagnosis not present

## 2018-11-03 HISTORY — DX: Bradycardia, unspecified: R00.1

## 2018-11-03 LAB — CBC
HCT: 40.3 % (ref 39.0–52.0)
Hemoglobin: 13.3 g/dL (ref 13.0–17.0)
MCH: 31.3 pg (ref 26.0–34.0)
MCHC: 33 g/dL (ref 30.0–36.0)
MCV: 94.8 fL (ref 80.0–100.0)
Platelets: 141 10*3/uL — ABNORMAL LOW (ref 150–400)
RBC: 4.25 MIL/uL (ref 4.22–5.81)
RDW: 12.8 % (ref 11.5–15.5)
WBC: 7.2 10*3/uL (ref 4.0–10.5)
nRBC: 0 % (ref 0.0–0.2)

## 2018-11-03 LAB — URINALYSIS, ROUTINE W REFLEX MICROSCOPIC
Bilirubin Urine: NEGATIVE
Glucose, UA: NEGATIVE mg/dL
Hgb urine dipstick: NEGATIVE
Ketones, ur: NEGATIVE mg/dL
Leukocytes,Ua: NEGATIVE
Nitrite: NEGATIVE
Protein, ur: NEGATIVE mg/dL
Specific Gravity, Urine: 1.009 (ref 1.005–1.030)
pH: 6 (ref 5.0–8.0)

## 2018-11-03 LAB — BASIC METABOLIC PANEL
Anion gap: 12 (ref 5–15)
BUN: 17 mg/dL (ref 8–23)
CO2: 22 mmol/L (ref 22–32)
Calcium: 9.2 mg/dL (ref 8.9–10.3)
Chloride: 105 mmol/L (ref 98–111)
Creatinine, Ser: 1.07 mg/dL (ref 0.61–1.24)
GFR calc Af Amer: >60 mL/min (ref >60)
GFR calc non Af Amer: >60 mL/min (ref >60)
Glucose, Bld: 197 mg/dL — ABNORMAL HIGH (ref 70–99)
Potassium: 3.6 mmol/L (ref 3.5–5.1)
Sodium: 139 mmol/L (ref 135–145)

## 2018-11-03 LAB — CBG MONITORING, ED: Glucose-Capillary: 198 mg/dL — ABNORMAL HIGH (ref 70–99)

## 2018-11-03 MED ORDER — SODIUM CHLORIDE 0.9% FLUSH: 3.0000 mL | INTRAVENOUS | Status: DC | PRN

## 2018-11-03 MED ORDER — SODIUM CHLORIDE 0.9% FLUSH: 3.0000 mL | Freq: Once | INTRAVENOUS | Status: DC

## 2018-11-03 MED ORDER — ZOLPIDEM TARTRATE 5 MG PO TABS: 5.0000 mg | ORAL_TABLET | Freq: Every evening | ORAL | Status: DC | PRN

## 2018-11-03 MED ORDER — SODIUM CHLORIDE 0.9 % IV SOLN: 250.0000 mL | INTRAVENOUS | Status: DC | PRN

## 2018-11-03 MED ORDER — ENOXAPARIN SODIUM 40 MG/0.4ML ~~LOC~~ SOLN
40.0000 mg | SUBCUTANEOUS | Status: DC
  Administered 2018-11-03 – 2018-11-04 (×2): 40 mg via SUBCUTANEOUS
  Filled 2018-11-03 (×2): qty 0.4

## 2018-11-03 MED ORDER — ONDANSETRON HCL 4 MG/2ML IJ SOLN: 4.0000 mg | Freq: Four times a day (QID) | INTRAMUSCULAR | Status: DC | PRN

## 2018-11-03 MED ORDER — SODIUM CHLORIDE 0.9 % IV BOLUS
500.0000 mL | Freq: Once | INTRAVENOUS | Status: AC
  Administered 2018-11-03: 11:00:00 500 mL via INTRAVENOUS

## 2018-11-03 MED ORDER — NITROGLYCERIN 0.4 MG SL SUBL: 0.4000 mg | SUBLINGUAL_TABLET | SUBLINGUAL | Status: DC | PRN

## 2018-11-03 MED ORDER — ALPRAZOLAM 0.25 MG PO TABS: 0.2500 mg | ORAL_TABLET | Freq: Two times a day (BID) | ORAL | Status: DC | PRN

## 2018-11-03 MED ORDER — SODIUM CHLORIDE 0.9% FLUSH
3.0000 mL | Freq: Two times a day (BID) | INTRAVENOUS | Status: DC
  Administered 2018-11-04 – 2018-11-05 (×3): 3 mL via INTRAVENOUS

## 2018-11-03 MED ORDER — ACETAMINOPHEN 325 MG PO TABS: 650.0000 mg | ORAL_TABLET | ORAL | Status: DC | PRN

## 2018-11-03 NOTE — ED Provider Notes (Signed)
[facility] EMERGENCY DEPARTMENT Provider Note   CSN: [id number] Arrival date & time: 11/03/18  0709     History   Chief Complaint Chief Complaint  Patient presents with  . Dizziness    HPI Kevin [name] is a 83 y.o. male.     HPI Patient presents to the emergency department with dizziness that started earlier this morning.  The patient states that he woke up around 5 AM and noticed he had an episode of dizziness lasted about 15 seconds.  Patient describes the dizziness as feeling lightheaded like he could pass out.  The patient states that his vision did get a little bit blurry during these episodes but did not sustain longer than the episode.  Patient states that he is unsure if he has had this type of sensation in the past but feels like he has.  The patient states he had 6 episodes this morning each lasting about 15 to 20 seconds.  Patient states that nothing seems make the condition better or worse.  He states that he does not notice any changes or worsening with his symptoms during activity.  Patient states that he did take his morning medicines today as well.  The patient denies chest pain, shortness of breath, headache,eck pain, fever, cough, weakness, numbness,anorexia, edema, abdominal pain, nausea, vomiting, diarrhea, rash, back pain, dysuria, hematemesis, bloody stool, near syncope, or syncope. Past Medical History:  Diagnosis Date  . Blood dyscrasia    chronic thrombocytopenia  . Diabetes mellitus without complication ([place])   . GERD (gastroesophageal reflux disease)   . Hypertension     There are no active problems to display for this patient.   Past Surgical History:  Procedure Laterality Date  . CARPAL TUNNEL RELEASE Left 04/28/2015   Procedure: LEFT WRIST CARPAL TUNNEL RELEASE;  Surgeon: Melrose Nakayama, MD;  Location: [facility];  Service: Orthopedics;  Laterality: Left;  . SEPTOPLASTY          Home Medications    Prior  to Admission medications   Medication Sig Start Date End Date Taking? Authorizing Provider  aspirin 81 MG tablet Take 81 mg by mouth daily.    [provider]  atenolol (TENORMIN) 100 MG tablet Take 50 mg by mouth 2 (two) times daily.    [provider]  atorvastatin (LIPITOR) 20 MG tablet Take 20 mg by mouth daily.    [provider]  docusate sodium (COLACE) 100 MG capsule Take 100 mg by mouth daily.    [provider]  hydrochlorothiazide (MICROZIDE) 12.5 MG capsule Take 12.5 mg by mouth daily.    [provider]  latanoprost (XALATAN) 0.005 % ophthalmic solution Place 1 drop into both eyes at bedtime.    [provider]  lisinopril (PRINIVIL,ZESTRIL) 30 MG tablet Take 30 mg by mouth 2 (two) times daily.    [provider]  metFORMIN (GLUCOPHAGE) 500 MG tablet Take 500 mg by mouth daily with breakfast.     [provider]  Multiple Vitamin (MULTIVITAMIN) capsule Take 1 capsule by mouth daily.    [provider]  psyllium (METAMUCIL) 58.6 % powder Take 1 packet by mouth 2 (two) times daily.    [provider]  ranitidine (ZANTAC) 150 MG tablet Take 150 mg by mouth 2 (two) times daily.    [provider]  vitamin E 200 UNIT capsule Take 200 Units by mouth daily.    [provider]    Family History  History reviewed. No pertinent family history.  Social History Social History   Tobacco Use  . Smoking status: Former Smoker    Types: Cigarettes  . Smokeless tobacco: Former Network engineer Use Topics  . Alcohol use: No  . Drug use: No     Allergies   Patient has no known allergies.   Review of Systems Review of Systems  All other systems negative except as documented in the HPI. All pertinent positives and negatives as reviewed in the HPI. Physical Exam Updated Vital Signs BP (!) 180/66 (BP Location: Left Arm)   Pulse 63   Temp 98.1 F (36.7 C) (Oral)   Resp 18   Ht  5\' 8"  (1.727 m)   Wt 79.4 kg   SpO2 98%   BMI 26.61 kg/m   Physical Exam Vitals signs and nursing note reviewed.  Constitutional:      General: He is not in acute distress.    Appearance: He is well-developed.  HENT:     Head: Normocephalic and atraumatic.  Eyes:     Pupils: Pupils are equal, round, and reactive to light.  Neck:     Musculoskeletal: Normal range of motion and neck supple.  Cardiovascular:     Rate and Rhythm: Normal rate and regular rhythm.     Heart sounds: Normal heart sounds. No murmur. No friction rub. No gallop.   Pulmonary:     Effort: Pulmonary effort is normal. No respiratory distress.     Breath sounds: Normal breath sounds. No wheezing.  Abdominal:     General: There is no distension.     Palpations: Abdomen is soft.     Tenderness: There is no abdominal tenderness. There is no guarding.  Skin:    General: Skin is warm and dry.     Capillary Refill: Capillary refill takes less than 2 seconds.     Findings: No erythema or rash.  Neurological:     Mental Status: He is alert and oriented to person, place, and time.     Motor: No abnormal muscle tone.     Coordination: Coordination normal.  Psychiatric:        Behavior: Behavior normal.      ED Treatments / Results  Labs (all labs ordered are listed, but only abnormal results are displayed) Labs Reviewed  BASIC METABOLIC PANEL - Abnormal; Notable for the following components:      Result Value   Glucose, Bld 197 (*)    All other components within normal limits  CBC - Abnormal; Notable for the following components:   Platelets 141 (*)    All other components within normal limits  CBG MONITORING, ED - Abnormal; Notable for the following components:   Glucose-Capillary 198 (*)    All other components within normal limits  URINALYSIS, ROUTINE W REFLEX MICROSCOPIC    EKG None  Radiology No results found.  Procedures Procedures (including critical care time)  Medications Ordered in  ED Medications  sodium chloride flush (NS) 0.9 % injection 3 mL (has no administration in time range)     Initial Impression / Assessment and Plan / ED Course  I have reviewed the triage vital signs and the nursing notes.  Pertinent labs & imaging results that were available during my care of the patient were reviewed by me and considered in my medical decision making (see chart for details).        Patient will be worked up for his dizziness.  Patient's symptoms last  15 to 20 seconds at a time.  Patient has not had any sustained symptoms for longer than that timeframe.  Patient's vital signs do show elevated blood pressure but no other abnormalities at this time.  Will await laboratory testing.  I asked cardiology to consult on the patient due to the fact that he has a bifascicular block new on his EKG.  Cardiology states they will see the patient. Final Clinical Impressions(s) / ED Diagnoses   Final diagnoses:  None    ED Discharge Orders    None       Dalia Heading, PA-C 11/03/18 1557    Pattricia Boss, MD 11/03/18 1617    Pattricia Boss, MD 11/09/18 1256

## 2018-11-03 NOTE — H&P (Signed)
Cardiology History and Physical:   Patient ID: Kevin [name]; [id number]; [birth year]   Admit date: 11/03/2018 Date of Consult: 11/03/2018  Primary Care Provider: Hulan Fess, MD Primary Cardiologist: No primary care provider on file. New, Dr Radford Pax Primary Electrophysiologist:  None   Patient Profile:   Kevin [name] is a 83 y.o. male with a hx of thrombocytopenia, DM, HTN, GERD, PAD, who is being seen today for the evaluation of presyncope at the request of Kevin [name], PAC.  History of Present Illness:   [name] has been taking atenolol 50 mg bid for a long time. He has noticed in the past a HR as low as 40. However, usually, his HR is in the 50s. He does not have a long hx of presyncope or syncope.   Today, he was fine when he got up. He took his am meds and ate breakfast. He had a slight dizzy spell before breakfast, then had another one after breakfast. He had another one, then was driving to work when he had the 4th one. He had another one as he was waiting for his son to come over to take him to the ER.   He checked his HR at home, but it was not that low. He came to the ER, has not had any more episodes since coming in here.   Prior to today, it has been a while since he checked his BP. It has been much longer since he had an ECG. He was aware of the RBBB, but not of the bifasicular block.   He works every day. He also cares for his wife (blind and had CVA with memory problems) with the help of his daughter (who lives with them).   He never gets chest pain.   He gets SOB with unusual activity levels, but not normally.    Past Medical History:  Diagnosis Date  . Blood dyscrasia    chronic thrombocytopenia  . Diabetes mellitus without complication ([place])   . GERD (gastroesophageal reflux disease)   . Hypertension   . Symptomatic bradycardia 11/03/2018    Past Surgical History:  Procedure Laterality Date  . CARPAL TUNNEL RELEASE Left 04/28/2015   Procedure:  LEFT WRIST CARPAL TUNNEL RELEASE;  Surgeon: Melrose Nakayama, MD;  Location: [facility];  Service: Orthopedics;  Laterality: Left;  . SEPTOPLASTY       Prior to Admission medications   Medication Sig Start Date End Date Taking? Authorizing Provider  aspirin 81 MG tablet Take 81 mg by mouth daily.    [provider]  atenolol (TENORMIN) 100 MG tablet Take 50 mg by mouth 2 (two) times daily.    [provider]  atorvastatin (LIPITOR) 20 MG tablet Take 20 mg by mouth daily.    [provider]  docusate sodium (COLACE) 100 MG capsule Take 100 mg by mouth daily.    [provider]  hydrochlorothiazide (MICROZIDE) 12.5 MG capsule Take 12.5 mg by mouth daily.    [provider]  latanoprost (XALATAN) 0.005 % ophthalmic solution Place 1 drop into both eyes at bedtime.    [provider]  lisinopril (PRINIVIL,ZESTRIL) 30 MG tablet Take 30 mg by mouth 2 (two) times daily.    [provider]  metFORMIN (GLUCOPHAGE) 500 MG tablet Take 500 mg by mouth daily with breakfast.     [provider]  Multiple Vitamin (MULTIVITAMIN) capsule Take 1 capsule by mouth daily.    [provider]  psyllium (  METAMUCIL) 58.6 % powder Take 1 packet by mouth 2 (two) times daily.    [provider]  ranitidine (ZANTAC) 150 MG tablet Take 150 mg by mouth 2 (two) times daily.    [provider]  vitamin E 200 UNIT capsule Take 200 Units by mouth daily.    [provider]    Inpatient Medications: Scheduled Meds: . sodium chloride flush  3 mL Intravenous Once   Continuous Infusions:  PRN Meds:   Allergies:   No Known Allergies  Social History:   Social History   Socioeconomic History  . Marital status: Married    Spouse name: Not on file  . Number of children: Not on file  . Years of education: Not on file  . Highest education level: Not on file  Occupational History  . Occupation: Tool and  Print production planner, Furniture conservator/restorer  Social Needs  . Financial resource strain: Not on file  . Food insecurity    Worry: Not on file    Inability: Not on file  . Transportation needs    Medical: Not on file    Non-medical: Not on file  Tobacco Use  . Smoking status: Former Smoker    Types: Cigarettes  . Smokeless tobacco: Former Network engineer and Sexual Activity  . Alcohol use: No  . Drug use: No  . Sexual activity: Not on file  Lifestyle  . Physical activity    Days per week: Not on file    Minutes per session: Not on file  . Stress: Not on file  Relationships  . Social Herbalist on phone: Not on file    Gets together: Not on file    Attends religious service: Not on file    Active member of club or organization: Not on file    Attends meetings of clubs or organizations: Not on file    Relationship status: Not on file  . Intimate partner violence    Fear of current or ex partner: Not on file    Emotionally abused: Not on file    Physically abused: Not on file    Forced sexual activity: Not on file  Other Topics Concern  . Not on file  Social History Narrative  . Not on file    Family History:   Family History  Problem Relation Age of Onset  . Heart attack Mother 29  . Stroke Father 68   Family Status:  Family Status  Relation Name Status  . Mother  Deceased  . Father  Deceased  . Sister  Alive  . Brother  Alive    ROS:  Please see the history of present illness.  All other ROS reviewed and negative.     Physical Exam/Data:   Vitals:   11/03/18 1700 11/03/18 1830 11/03/18 1845 11/03/18 1900  BP: (!) 155/66 (!) 170/71 (!) 168/74 (!) 169/73  Pulse: (!) 42 (!) 50 (!) 47 (!) 48  Resp: 17 17 16 17   Temp:      TempSrc:      SpO2: 98% 99% 98% 99%  Weight:      Height:       No intake or output data in the 24 hours ending 11/03/18 1951 Filed Weights   11/03/18 0725  Weight: 79.4 kg   Body mass index is 26.61 kg/m.  General:  Well nourished, well  developed, elderly male in no acute distress HEENT: normal Lymph: no adenopathy Neck: JVD - not elevated Endocrine:  No thryomegaly Vascular: No carotid bruits; 4/4 extremity pulses 2+, without bruits  Cardiac:  normal S1, S2; RRR; no murmur  Lungs:  clear bilaterally, no wheezing, rhonchi or rales  Abd: soft, nontender, no hepatomegaly  Ext: no edema Musculoskeletal:  No deformities, BUE and BLE strength normal and equal Skin: warm and dry  Neuro:  CNs 2-12 intact, no focal abnormalities noted Psych:  Normal affect   EKG:  The EKG was personally reviewed and demonstrates:  S brady, HR 56, RBBB, LAFB  Telemetry:  Telemetry was personally reviewed and demonstrates:  SR, S brady, HR high 30s at times, ?non-conducted PACs but not 2:1 block   CV studies:   None   Laboratory Data:   Chemistry Recent Labs  Lab 11/03/18 0731  NA 139  K 3.6  CL 105  CO2 22  GLUCOSE 197*  BUN 17  CREATININE 1.07  CALCIUM 9.2  GFRNONAA >60  GFRAA >60  ANIONGAP 12    No results found for: ALT, AST, GGT, ALKPHOS, BILITOT Hematology Recent Labs  Lab 11/03/18 0731  WBC 7.2  RBC 4.25  HGB 13.3  HCT 40.3  MCV 94.8  MCH 31.3  MCHC 33.0  RDW 12.8  PLT 141*   Cardiac Enzymes High Sensitivity Troponin:  No results for input(s): TROPONINIHS in the last 720 hours.    Other Troponin:No results for input(s): TROPONINI in the last 168 hours. No results for input(s): TROPIPOC in the last 168 hours.  BNPNo results for input(s): BNP, PROBNP in the last 168 hours.  DDimer No results for input(s): DDIMER in the last 168 hours. TSH: No results found for: TSH Lipids:No results found for: CHOL, HDL, LDLCALC, LDLDIRECT, TRIG, CHOLHDL HgbA1c:No results found for: HGBA1C Magnesium: No results found for: MG   Radiology/Studies:  No results found.  Assessment and Plan:   1. Bradycardia - no sx w/ HR high 30s, suspect HR was lower when he was lightheaded - d/c atenolol - follow HR, monitor ECG  - if no evidence of chronotropic incompetence, may not need PPM  2. HTN - BP is up since atenolol is wearing off - continue lisinopril and HCTZ at home doses - add hydralazine 25 mg tid  3. DM - add SSI, hold metformin in case dye study needed  Principal Problem:   Symptomatic bradycardia Active Problems:   Hypertension   Diabetes mellitus without complication ([place])     For questions or updates, please contact [facility] Please consult [email or web address] for contact info under Cardiology/STEMI.   Jonetta Speak, PA-C  11/03/2018 7:51 PM

## 2018-11-03 NOTE — ED Provider Notes (Signed)
  Physical Exam  BP (!) 148/63   Pulse (!) 39   Temp 98.1 F (36.7 C) (Oral)   Resp 18   Ht 5\' 8"  (1.727 m)   Wt 79.4 kg   SpO2 98%   BMI 26.61 kg/m   Physical Exam  ED Course/Procedures   Clinical Course as of Nov 03 2035  Tue Nov 03, 2018  2028 Patient to be admitted by cardiology for further monitoring and work-up.   [KM]    Clinical Course User Index [KM] Alveria Apley, PA-C    Procedures  MDM  Patient care assumed from Cecilie Kicks, [place] due to change of shift.  83 year old male presenting to the emergency department for presyncopal episode several times today.  No actual syncope.  He does have bradycardia at baseline but stated during these episodes his heart rate dipped lower into the 30s.  Due to EKG findings and bradycardia will have cardiology evaluate.  Patient was orthostatic and was given some fluids.  If he is cleared by cardiology and is able to ambulate normally patient may go home with primary care doctor follow-up.  He has had no other symptoms while in the emergency department.       Kristine Royal 11/03/18 2037    Maudie Flakes, MD 11/03/18 2049

## 2018-11-03 NOTE — ED Notes (Signed)
Report attempted 

## 2018-11-03 NOTE — ED Triage Notes (Signed)
Pt arrives via POV from home states checked his blood pressure this morning noted to be high, noted hr in the 90s. Denies SOB, CP. Reports felt dizzy this morning. Stroke scale negative. Van neg. VSS.

## 2018-11-04 ENCOUNTER — Ambulatory Visit (HOSPITAL_BASED_OUTPATIENT_CLINIC_OR_DEPARTMENT_OTHER): Payer: Medicare Other

## 2018-11-04 DIAGNOSIS — R001 Bradycardia, unspecified: Secondary | ICD-10-CM

## 2018-11-04 DIAGNOSIS — R55 Syncope and collapse: Secondary | ICD-10-CM | POA: Diagnosis not present

## 2018-11-04 DIAGNOSIS — E119 Type 2 diabetes mellitus without complications: Secondary | ICD-10-CM | POA: Diagnosis not present

## 2018-11-04 DIAGNOSIS — I361 Nonrheumatic tricuspid (valve) insufficiency: Secondary | ICD-10-CM

## 2018-11-04 DIAGNOSIS — Z20828 Contact with and (suspected) exposure to other viral communicable diseases: Secondary | ICD-10-CM | POA: Diagnosis not present

## 2018-11-04 DIAGNOSIS — I10 Essential (primary) hypertension: Secondary | ICD-10-CM | POA: Diagnosis not present

## 2018-11-04 LAB — COMPREHENSIVE METABOLIC PANEL
ALT: 17 U/L (ref 0–44)
AST: 20 U/L (ref 15–41)
Albumin: 3.5 g/dL (ref 3.5–5.0)
Alkaline Phosphatase: 47 U/L (ref 38–126)
Anion gap: 10 (ref 5–15)
BUN: 15 mg/dL (ref 8–23)
CO2: 26 mmol/L (ref 22–32)
Calcium: 9.1 mg/dL (ref 8.9–10.3)
Chloride: 105 mmol/L (ref 98–111)
Creatinine, Ser: 1.04 mg/dL (ref 0.61–1.24)
GFR calc Af Amer: >60 mL/min (ref >60)
GFR calc non Af Amer: >60 mL/min (ref >60)
Glucose, Bld: 149 mg/dL — ABNORMAL HIGH (ref 70–99)
Potassium: 3.6 mmol/L (ref 3.5–5.1)
Sodium: 141 mmol/L (ref 135–145)
Total Bilirubin: 1.3 mg/dL — ABNORMAL HIGH (ref 0.3–1.2)
Total Protein: 6 g/dL — ABNORMAL LOW (ref 6.5–8.1)

## 2018-11-04 LAB — GLUCOSE, CAPILLARY
Glucose-Capillary: 107 mg/dL — ABNORMAL HIGH (ref 70–99)
Glucose-Capillary: 109 mg/dL — ABNORMAL HIGH (ref 70–99)
Glucose-Capillary: 81 mg/dL (ref 70–99)
Glucose-Capillary: 88 mg/dL (ref 70–99)
Glucose-Capillary: 97 mg/dL (ref 70–99)

## 2018-11-04 LAB — SARS CORONAVIRUS 2 (TAT 6-24 HRS): SARS Coronavirus 2: NEGATIVE

## 2018-11-04 MED ORDER — ATORVASTATIN CALCIUM 10 MG PO TABS
20.0000 mg | ORAL_TABLET | Freq: Every day | ORAL | Status: DC
  Administered 2018-11-04: 20 mg via ORAL
  Filled 2018-11-04: qty 2

## 2018-11-04 MED ORDER — CALCIUM CARBONATE ANTACID 500 MG PO CHEW
1.0000 | CHEWABLE_TABLET | Freq: Four times a day (QID) | ORAL | Status: DC | PRN
  Administered 2018-11-04: 400 mg via ORAL
  Filled 2018-11-04: qty 2

## 2018-11-04 NOTE — Progress Notes (Signed)
  Echocardiogram 2D Echocardiogram has been performed.  Matilde Bash 11/04/2018, 11:43 AM

## 2018-11-04 NOTE — Progress Notes (Signed)
Progress Note  Patient Name: Kevin [name] Date of Encounter: 11/04/2018  Primary Cardiologist: New, Dr. Radford Pax  Subjective   Admitted yesterday with presyncope. Overnight HR remained stable, no pauses. This AM sinus bradycardia in the 50s, briefly 60 bpm.  Overall feeling well. No presyncope. Has been up and walking without issues. BP has been well controlled not on his home meds yet. No chest pain or shortness of breath. Denies any full syncope.  Inpatient Medications    Scheduled Meds: . atorvastatin  20 mg Oral q1800  . enoxaparin (LOVENOX) injection  40 mg Subcutaneous Q24H  . sodium chloride flush  3 mL Intravenous Once  . sodium chloride flush  3 mL Intravenous Q12H   Continuous Infusions: . sodium chloride     PRN Meds: sodium chloride, acetaminophen, ALPRAZolam, nitroGLYCERIN, ondansetron (ZOFRAN) IV, sodium chloride flush, zolpidem   Vital Signs    Vitals:   11/03/18 2330 11/03/18 2354 11/04/18 0000 11/04/18 0453  BP: (!) 133/54 (!) 166/66  (!) 121/41  Pulse: (!) 46 (!) 47 (!) 43 (!) 40  Resp: 16 20 18 16   Temp:  97.7 F (36.5 C)  98.5 F (36.9 C)  TempSrc:  Oral  Oral  SpO2: 95% 100% 99% 99%  Weight:  78.1 kg  78.1 kg  Height:  5\' 8"  (1.727 m)      Intake/Output Summary (Last 24 hours) at 11/04/2018 1043 Last data filed at 11/04/2018 0807 Gross per 24 hour  Intake -  Output 1000 ml  Net -1000 ml   Last 3 Weights 11/04/2018 11/03/2018 11/03/2018  Weight (lbs) 172 lb 2.9 oz 172 lb 2.9 oz 175 lb  Weight (kg) 78.1 kg 78.1 kg 79.379 kg      Telemetry    Sinus bradycardia, no pauses - Personally Reviewed  ECG    Sinus bradycardia, 1st degree AV block, IVCB (right bundle-oid), LAFB - Personally Reviewed  Physical Exam   GEN: No acute distress.   Neck: No JVD Cardiac: bradycardic but regular S1 and S2, no rubs, or gallops. 1/6 SM Respiratory: Clear to auscultation bilaterally. GI: Soft, nontender, non-distended  MS: No edema; No  deformity. Neuro:  Nonfocal  Psych: Normal affect   Labs    High Sensitivity Troponin:  No results for input(s): TROPONINIHS in the last 720 hours.    Chemistry Recent Labs  Lab 11/03/18 0731 11/04/18 0329  NA 139 141  K 3.6 3.6  CL 105 105  CO2 22 26  GLUCOSE 197* 149*  BUN 17 15  CREATININE 1.07 1.04  CALCIUM 9.2 9.1  PROT  --  6.0*  ALBUMIN  --  3.5  AST  --  20  ALT  --  17  ALKPHOS  --  47  BILITOT  --  1.3*  GFRNONAA >60 >60  GFRAA >60 >60  ANIONGAP 12 10     Hematology Recent Labs  Lab 11/03/18 0731  WBC 7.2  RBC 4.25  HGB 13.3  HCT 40.3  MCV 94.8  MCH 31.3  MCHC 33.0  RDW 12.8  PLT 141*    BNPNo results for input(s): BNP, PROBNP in the last 168 hours.   DDimer No results for input(s): DDIMER in the last 168 hours.   Radiology    No results found.  Cardiac Studies   Prior vasc studies reviewed  Patient Profile     83 y.o. male who presented with episodic lightheadedness, found to have sinus bradycardia with underlying conduction disease. Being monitored after  beta blocker held.  Assessment & Plan    Bradycardia, presyncope -asymptomatic on presentation with HR in the high 30s. No pauses overnight, this AM in the mid-50s -no AV nodal agents, holding home atenolol -after washout, would have him ambulate to make sure HR rises, no pauses. Did not rise significantly on ambulation today -has RBBB-type IVCB as well as LAFB and 1st degree AV block on ECG -continue to watch on telemetry -no current indication for PPM -will get echo to rule out structural cause for symptoms  HTN -well controlled this AM, current home medications not ordered (HCTZ 12.5 mg, lisinopril 30 mg BID) -was hypertensive on admission, but with current BP question if he was hypotensive prior to his symptoms  DM -add SSI, hold metformin in case dye study needed  History of PAD: -on aspirin, statin at home. Holding aspirin in case of PPM due to chronic  thrombocytopenia -known baseline thrombocytopenia without active bleeding  For questions or updates, please contact [place] Please consult [email or web address] for contact info under     Signed, Buford Dresser, MD  11/04/2018, 10:43 AM

## 2018-11-04 NOTE — Care Management Obs Status (Signed)
[facility] NOTIFICATION   Patient Details  Name: Kevin [name] MRN: [id number] Date of Birth: 1935-02-01   Medicare Observation Status Notification Given:  Yes    Bethena Roys, RN 11/04/2018, 4:51 PM

## 2018-11-05 ENCOUNTER — Other Ambulatory Visit: Payer: Self-pay | Admitting: Medical

## 2018-11-05 DIAGNOSIS — R55 Syncope and collapse: Secondary | ICD-10-CM

## 2018-11-05 DIAGNOSIS — R001 Bradycardia, unspecified: Secondary | ICD-10-CM | POA: Diagnosis not present

## 2018-11-05 DIAGNOSIS — E785 Hyperlipidemia, unspecified: Secondary | ICD-10-CM

## 2018-11-05 DIAGNOSIS — I10 Essential (primary) hypertension: Secondary | ICD-10-CM | POA: Diagnosis not present

## 2018-11-05 DIAGNOSIS — Z20828 Contact with and (suspected) exposure to other viral communicable diseases: Secondary | ICD-10-CM | POA: Diagnosis not present

## 2018-11-05 DIAGNOSIS — E119 Type 2 diabetes mellitus without complications: Secondary | ICD-10-CM | POA: Diagnosis not present

## 2018-11-05 HISTORY — DX: Hyperlipidemia, unspecified: E78.5

## 2018-11-05 HISTORY — DX: Syncope and collapse: R55

## 2018-11-05 LAB — GLUCOSE, CAPILLARY
Glucose-Capillary: 163 mg/dL — ABNORMAL HIGH (ref 70–99)
Glucose-Capillary: 102 mg/dL — ABNORMAL HIGH (ref 70–99)

## 2018-11-05 LAB — ECHOCARDIOGRAM COMPLETE
Height: 68 in
Weight: 2754.87 oz

## 2018-11-05 MED ORDER — LISINOPRIL 30 MG PO TABS: 30.0000 mg | ORAL_TABLET | Freq: Every day | ORAL | 0 refills | Status: DC

## 2018-11-05 MED ORDER — LISINOPRIL 20 MG PO TABS
30.0000 mg | ORAL_TABLET | Freq: Every day | ORAL | Status: DC
  Administered 2018-11-05: 30 mg via ORAL
  Filled 2018-11-05: qty 1

## 2018-11-05 NOTE — Discharge Summary (Signed)
Discharge Summary    Patient ID: Kevin [name] MRN: [id number]; DOB: 04-08-1935  Admit date: 11/03/2018 Discharge date: 11/05/2018  Primary Care Provider: Hulan Fess, MD  Primary Cardiologist: Fransico Him, MD  Primary Electrophysiologist:  None   Discharge Diagnoses    Principal Problem:   Pre-syncope Active Problems:   Hypertension   Diabetes mellitus without complication ([place])   HLD (hyperlipidemia)   Allergies No Known Allergies  Diagnostic Studies/Procedures    Echocardiogram 11/04/2018: 1. Left ventricular ejection fraction, by visual estimation, is 55 to 60%. The left ventricle has normal function. Left ventricular septal wall thickness was normal. Normal left ventricular posterior wall thickness. There is no left ventricular  hypertrophy.  2. Left ventricular diastolic Doppler parameters are consistent with pseudonormalization pattern of LV diastolic filling.  3. Global right ventricle has normal systolic function.The right ventricular size is normal. No increase in right ventricular wall thickness.  4. Left atrial size was normal.  5. Right atrial size was normal.  6. The mitral valve is normal in structure. Trace mitral valve regurgitation.  7. The tricuspid valve is normal in structure. Tricuspid valve regurgitation is mild.  8. The aortic valve is tricuspid Aortic valve regurgitation was not visualized by color flow Doppler.  9. The pulmonic valve was grossly normal. Pulmonic valve regurgitation is trivial by color flow Doppler. 10. Normal pulmonary artery systolic pressure. 11. The inferior vena cava is normal in size with greater than 50% respiratory variability, suggesting right atrial pressure of 3 mmHg. _____________   History of Present Illness     Kevin [name] is a 83 y.o. male with hx of thrombocytopenia, DM, HTN, GERD, PAD, who presented with presyncope. He has been taking atenolol 50 mg bid for a long time. He has noticed in the past a HR as  low as 40. However, usually, his HR is in the 50s. He does not have a long hx of presyncope or syncope.   Today, he was fine when he got up. He took his am meds and ate breakfast. He had a slight dizzy spell before breakfast, then had another one after breakfast. He had another one, then was driving to work when he had the 4th one. He had another one as he was waiting for his son to come over to take him to the ER.   He checked his HR at home, but it was not that low. He came to the ER, has not had any more episodes since coming in here.   Prior to today, it has been a while since he checked his BP. It has been much longer since he had an ECG. He was aware of the RBBB, but not of the bifasicular block.   He works every day. He also cares for his wife (blind and had CVA with memory problems) with the help of his daughter (who lives with them).   He never gets chest pain.   He gets SOB with unusual activity levels, but not normally.    Hospital Course     Consultants: None   1. Presyncope: patient presented with multiple episodes of dizziness/lightheadedness prior to arrival to the ED. No loss of consciousness. HR was in the high 30 in the ED. Possible he was having symptomatic bradycardia. His atenolol was discontinued. HR remained stable throughout admission without recurrent pre-syncope. Echo with EF 55-60%, G2DD, trace MR, mild TR. No clear explanation for symptoms - Will plan for outpatient cardiac monitor to further evaluate for symptomatic  bradycardia and arrhythmias.   2. HTN: BP elevated with discontinuation of atenolol. Home lisinopril and HCTZ held for possible hypotension prior to arrival.  - Continue lisinopril 30mg  daily at discharge - Can consider restarting HCTZ if BP stable at his follow-up appointment.   3. DM type 2: metformin held this admission and maintained on ISS - Continue metformin   4. PAD: Followed by Dr. Donzetta Matters - Continue aspirin and statin  5. HLD: -  Continue statin  Did the patient have an acute coronary syndrome (MI, NSTEMI, STEMI, etc) this admission?:  No                               Did the patient have a percutaneous coronary intervention (stent / angioplasty)?:  No.   _____________  Discharge Vitals Blood pressure 133/66, pulse (!) 54, temperature 97.7 F (36.5 C), temperature source Oral, resp. rate 17, height 5\' 8"  (1.727 m), weight 76.3 kg, SpO2 98 %.  Filed Weights   11/03/18 2354 11/04/18 0453 11/05/18 0532  Weight: 78.1 kg 78.1 kg 76.3 kg    Labs & Radiologic Studies    CBC Recent Labs    11/03/18 0731  WBC 7.2  HGB 13.3  HCT 40.3  MCV 94.8  PLT [id number]*   Basic Metabolic Panel Recent Labs    11/03/18 0731 11/04/18 0329  NA 139 141  K 3.6 3.6  CL 105 105  CO2 22 26  GLUCOSE 197* 149*  BUN 17 15  CREATININE 1.07 1.04  CALCIUM 9.2 9.1   Liver Function Tests Recent Labs    11/04/18 0329  AST 20  ALT 17  ALKPHOS 47  BILITOT 1.3*  PROT 6.0*  ALBUMIN 3.5   No results for input(s): LIPASE, AMYLASE in the last 72 hours. High Sensitivity Troponin:   No results for input(s): TROPONINIHS in the last 720 hours.  BNP Invalid input(s): POCBNP D-Dimer No results for input(s): DDIMER in the last 72 hours. Hemoglobin A1C No results for input(s): HGBA1C in the last 72 hours. Fasting Lipid Panel No results for input(s): CHOL, HDL, LDLCALC, TRIG, CHOLHDL, LDLDIRECT in the last 72 hours. Thyroid Function Tests No results for input(s): TSH, T4TOTAL, T3FREE, THYROIDAB in the last 72 hours.  Invalid input(s): FREET3 _____________  No results found. Disposition   Patient was seen and examined by Dr. Harrell Gave who deemed patient as stable for discharge. Follow-up has been arranged. Discharge medications as listed below.    Follow-up Plans & Appointments       Discharge Medications   Allergies as of 11/05/2018   No Known Allergies     Medication List    STOP taking these medications    atenolol 100 MG tablet Commonly known as: TENORMIN   hydrochlorothiazide 12.5 MG capsule Commonly known as: MICROZIDE     TAKE these medications   aspirin 81 MG tablet Take 81 mg by mouth daily.   atorvastatin 20 MG tablet Commonly known as: LIPITOR Take 20 mg by mouth daily.   docusate sodium 100 MG capsule Commonly known as: COLACE Take 100 mg by mouth 2 (two) times daily.   latanoprost 0.005 % ophthalmic solution Commonly known as: XALATAN Place 1 drop into both eyes at bedtime.   lisinopril 30 MG tablet Commonly known as: ZESTRIL Take 1 tablet (30 mg total) by mouth daily. What changed: when to take this   metFORMIN 500 MG tablet Commonly known as: GLUCOPHAGE Take 500  mg by mouth daily with breakfast.   multivitamin capsule Take 1 capsule by mouth daily.   omeprazole 20 MG capsule Commonly known as: PRILOSEC Take 20 mg by mouth daily.   psyllium 58.6 % powder Commonly known as: METAMUCIL Take 1 packet by mouth 2 (two) times daily.   ranitidine 150 MG tablet Commonly known as: ZANTAC Take 150 mg by mouth 2 (two) times daily.   vitamin E 200 UNIT capsule Take 200 Units by mouth daily.          Outstanding Labs/Studies   Cardiac event monitor ordered at discharge.   Duration of Discharge Encounter   Greater than 30 minutes including physician time.  Signed, Abigail Butts, PA-C 11/05/2018, 1:21 PM

## 2018-11-05 NOTE — Discharge Instructions (Signed)
°  Please keep a log of your blood pressures, morning and night, to bring to your follow-up appointment. If you blood pressure is persistently greater than 130/80, please contact the office for recommendations.   Medication changes: STOP hydrochlorothiazide (HCTZ) - we will consider restarting this medication based on your blood pressure log STOP atenolol - this medication may have contributed to your symptoms DECREASE lisinopril - take 30mg  ONCE daily       Near-Syncope Near-syncope is when you suddenly get weak or dizzy, or you feel like you might pass out (faint). This may also be called presyncope. This is due to a lack of blood flow to the brain. During an episode of near-syncope, you may:  Feel dizzy, weak, or light-headed.  Feel sick to your stomach (nauseous).  See all white or all black.  See spots.  Have cold, clammy skin. This condition is caused by a sudden decrease in blood flow to the brain. This decrease can result from various causes, but most of those causes are not dangerous. However, near-syncope may be a sign of a serious medical problem, so it is important to seek medical care. Follow these instructions at home: Medicines  Take over-the-counter and prescription medicines only as told by your doctor.  If you are taking blood pressure or heart medicine, get up slowly and spend many minutes getting ready to sit and then stand. This can help with dizziness. General instructions  Be aware of any changes in your symptoms.  Talk with your doctor about your symptoms. You may need to have testing to find the cause of your near-syncope.  If you start to feel like you might pass out, lie down right away. Raise (elevate) your feet above the level of your heart. Breathe deeply and steadily. Wait until all of the symptoms are gone.  Have someone stay with you until you feel stable.  Do not drive, use machinery, or play sports until your doctor says it is okay.  Drink  enough fluid to keep your pee (urine) pale yellow.  Keep all follow-up visits as told by your doctor. This is important. Get help right away if you:  Have a seizure.  Have pain in your: ? Chest. ? Belly (abdomen). ? Back.  Faint once or more than once.  Have a very bad headache.  Are bleeding from your mouth or butt.  Have black or tarry poop (stool).  Have a very fast or uneven heartbeat (palpitations).  Are mixed up (confused).  Have trouble walking.  Are very weak.  Have trouble seeing. These symptoms may be an emergency. Do not wait to see if the symptoms will go away. Get medical help right away. Call your local emergency services (911 in the [place] Do not drive yourself to the hospital. Summary  Near-syncope is when you suddenly get weak or dizzy, or you feel like you might pass out (faint).  This condition is caused by a lack of blood flow to the brain.  Near-syncope may be a sign of a serious medical problem, so it is important to seek medical care. This information is not intended to replace advice given to you by your health care provider. Make sure you discuss any questions you have with your health care provider. Document Released: 06/26/2007 Document Revised: 05/01/2018 Document Reviewed: 11/26/2017 Elsevier Patient Education  2020 [employer].

## 2018-11-10 ENCOUNTER — Telehealth: Payer: Self-pay | Admitting: Physician Assistant

## 2018-11-10 NOTE — Telephone Encounter (Signed)
Lattie Haw, daughter of the patient called and inquired about a heart monitor. The patient was recently in the hospital and was told he would be getting a heart monitor. The daughter just wants to make sure that is still the case. They have not heard anything yet from the office. The patient can be reached on his cell phone until around 3pm daily, then it is best to reach him on his house phone after that.

## 2018-11-10 NOTE — Telephone Encounter (Signed)
[employer] to mail a 30 day cardiac event monitor to the patients home.  Instructions reviewed briefly as they are included in the monitor kit.

## 2018-11-17 ENCOUNTER — Ambulatory Visit (INDEPENDENT_AMBULATORY_CARE_PROVIDER_SITE_OTHER): Payer: Medicare Other

## 2018-11-17 DIAGNOSIS — R55 Syncope and collapse: Secondary | ICD-10-CM | POA: Diagnosis not present

## 2018-11-18 ENCOUNTER — Encounter: Payer: Self-pay | Admitting: Physician Assistant

## 2018-11-18 NOTE — Progress Notes (Signed)
0   Cardiology Office Note    Date:  11/20/2018   ID:  DWIJ FOSHEE, DOB 1935-04-05, MRN [id number]  PCP:  Hulan Fess, MD  Cardiologist:  Fransico Him, MD  Electrophysiologist:  None   Chief Complaint: f/u presyncope  History of Present Illness:   Kevin [name] is a 83 y.o. male with history of thrombocytopenia, DM, HTN, GERD,PAD and chronic venous insufficiency (managed conservatively by Dr. Donzetta Matters), sinus bradycardia, trifascicular block, former tobacco use who presents for post-hospital follow-up.  Prior to recent evaluation he did not have any cardiac history diagnosed aside from what sounds like a chronic RBBB. He had been on atenolol for a long time and had noticed a HR in the 40s in the pasat but mostly 50s. He was admitted 10/2018 with pre-syncope described as multiple episodes of transient dizzy spells. Heart rate was in the high 30s in the ER. His atenolol (50mg  BID) was discontinued. HR remained stable throughout admission without recurrent pre-syncope. Echo showed EF 55-60%, G2DD, trace MR, mild TR. Lisinopril was decreased from 30mg  BID to 30mg  daily and HCTZ was held in case of component of hypotension. Outpatient 30 day monitor was placed. The patient still works every day in the tool/die industry. He also cares for his wife (blind and had CVA with memory problems) with the help of his daughter (who lives with them). Last labs 10/2018 K 3.6, Cr 1.04, normal AST/ALT, Hgb 13.3, Plt 141; last TSH 01/2018 wnl.  He returns for follow-up today feeling well. He has not had any recurrent pre-syncope. He does not report any chest pain, dyspnea, edema, orthopnea, or palpitations. He has been taking diligent records of his blood pressure and HR. His BP has been elevated first thing in the mornings to an average of 123456 systolic, coming from to an average of 140s-150s by the afternoon, but variable (120s-160s). He is wearing his Zio.   Past Medical History:  Diagnosis Date  . Blood  dyscrasia    chronic thrombocytopenia  . Chronic venous insufficiency   . Diabetes mellitus type 2 in nonobese ([facility])   . Diabetes mellitus without complication ([place])   . GERD (gastroesophageal reflux disease)   . HLD (hyperlipidemia) 11/05/2018  . Hypertension   . PAD (peripheral artery disease) ([place])   . Pre-syncope 11/05/2018  . Symptomatic bradycardia 11/03/2018    Past Surgical History:  Procedure Laterality Date  . CARPAL TUNNEL RELEASE Left 04/28/2015   Procedure: LEFT WRIST CARPAL TUNNEL RELEASE;  Surgeon: Melrose Nakayama, MD;  Location: [facility];  Service: Orthopedics;  Laterality: Left;  . SEPTOPLASTY      Current Medications: Current Meds  Medication Sig  . aspirin 81 MG tablet Take 81 mg by mouth daily.  Marland Kitchen atorvastatin (LIPITOR) 20 MG tablet Take 20 mg by mouth daily.  Marland Kitchen docusate sodium (COLACE) 100 MG capsule Take 100 mg by mouth 2 (two) times daily.   Marland Kitchen latanoprost (XALATAN) 0.005 % ophthalmic solution Place 1 drop into both eyes at bedtime.  Marland Kitchen levothyroxine (SYNTHROID) 50 MCG tablet Take 50 mcg by mouth every morning.  . metFORMIN (GLUCOPHAGE) 500 MG tablet Take 500 mg by mouth daily with breakfast.   . Multiple Vitamin (MULTIVITAMIN) capsule Take 1 capsule by mouth daily.  Marland Kitchen omeprazole (PRILOSEC) 20 MG capsule Take 20 mg by mouth daily.  . psyllium (METAMUCIL) 58.6 % powder Take 1 packet by mouth 2 (two) times daily.  . vitamin E 200 UNIT capsule Take 200 Units by  mouth daily.  . [DISCONTINUED] lisinopril (ZESTRIL) 30 MG tablet Take 1 tablet (30 mg total) by mouth daily.     Allergies:   Patient has no known allergies.   Social History   Socioeconomic History  . Marital status: Married    Spouse name: Not on file  . Number of children: Not on file  . Years of education: Not on file  . Highest education level: Not on file  Occupational History  . Occupation: Tool and Print production planner, Furniture conservator/restorer  Social Needs  . Financial resource strain: Not on  file  . Food insecurity    Worry: Not on file    Inability: Not on file  . Transportation needs    Medical: Not on file    Non-medical: Not on file  Tobacco Use  . Smoking status: Former Smoker    Types: Cigarettes  . Smokeless tobacco: Former Network engineer and Sexual Activity  . Alcohol use: No  . Drug use: No  . Sexual activity: Not on file  Lifestyle  . Physical activity    Days per week: Not on file    Minutes per session: Not on file  . Stress: Not on file  Relationships  . Social Herbalist on phone: Not on file    Gets together: Not on file    Attends religious service: Not on file    Active member of club or organization: Not on file    Attends meetings of clubs or organizations: Not on file    Relationship status: Not on file  Other Topics Concern  . Not on file  Social History Narrative  . Not on file     Family History:  The patient's family history includes Heart attack (age of onset: 65) in his mother; Stroke (age of onset: 65) in his father.  ROS:   Please see the history of present illness. All other systems are reviewed and otherwise negative.    EKGs/Labs/Other Studies Reviewed:    Studies reviewed were summarized above.   EKG:  EKG is ordered today, personally reviewed, demonstrating NSR/sinus arrhythmia 66bpm, first degree AVB, RBBB, LAFB, minimal voltage criteria for LVH, nonspecific STT changes  Recent Labs: 11/03/2018: Hemoglobin 13.3; Platelets 141 11/04/2018: ALT 17; BUN 15; Creatinine, Ser 1.04; Potassium 3.6; Sodium 141  Recent Lipid Panel No results found for: CHOL, TRIG, HDL, CHOLHDL, VLDL, LDLCALC, LDLDIRECT  PHYSICAL EXAM:    VS:  BP (!) 144/76   Pulse 72   Ht 5' 8.5" (1.74 m)   Wt 176 lb 9.6 oz (80.1 kg)   SpO2 98%   BMI 26.46 kg/m   BMI: Body mass index is 26.46 kg/m.  GEN: Well nourished, well developed WM, in no acute distress HEENT: normocephalic, atraumatic Neck: no JVD, carotid bruits, or masses  Cardiac: RRR; no murmurs, rubs, or gallops, no edema  Respiratory:  clear to auscultation bilaterally, normal work of breathing GI: soft, nontender, nondistended, + BS MS: no deformity or atrophy Skin: warm and dry, no rash Neuro:  Alert and Oriented x 3, Strength and sensation are intact, follows commands Psych: euthymic mood, full affect  Wt Readings from Last 3 Encounters:  11/20/18 176 lb 9.6 oz (80.1 kg)  11/05/18 168 lb 3.4 oz (76.3 kg)  06/07/16 170 lb (77.1 kg)     ASSESSMENT & PLAN:   1. Pre-syncope - suspect related to symptomatic bradycardia. This has not recurred off of beta blocker therapy. Continue monitor as planned.  2. Bradycardia with trifascicular block - EKG shows NSR/SA with RBBB, LAFB, and first degree AVB. He will continue to wear the monitor. He checks his vitals diligently twice a day and all of his readings for HR have been between 53-97. There was not felt to be a current indication for PPM but he will continue to wear the monitor to assess HR trend. Will need to see EP if any evidence of heart block on the study. Given his baseline conduction disease he is at risk for needing PPM in the future (even if not necessary now) so we discussed importance of seeking care for any recurrent spells. He is very functional and still works so would not have any contraindication to PPM if a need was determined. Will also check thyroid function since he has known hypothyroidism. 3. Essential HTN - BP moderately elevated by home readings. There was recommendation to consider resuming HCTZ, but will try the simplest approach by simply adjusting what he's currently on. Will increase lisinopril from 30mg  to 40mg  daily. Would probably not want to be much more aggressive with his BP control until we see how his monitor looks. 4. PAD - no claudication symptoms reported presently. Lipids are followed in primary care. He remains on aspirin at this time with stable Hgb recently.  Disposition:  F/u with myself virtually after estimation of when monitor will result.  Medication Adjustments/Labs and Tests Ordered: Current medicines are reviewed at length with the patient today.  Concerns regarding medicines are outlined above. Medication changes, Labs and Tests ordered today are summarized above and listed in the Patient Instructions accessible in Encounters.   Signed, Charlie Pitter, PA-C  11/20/2018 11:39 AM    [address], [place], [place]  [address] Phone: [phone]; Fax: [phone]

## 2018-11-19 ENCOUNTER — Telehealth: Payer: Self-pay | Admitting: Physician Assistant

## 2018-11-19 NOTE — Telephone Encounter (Signed)
New Message:    Daughter called, she wants to know if she can come in with pt tomorrow for his appt with Melina Copa please?

## 2018-11-19 NOTE — Telephone Encounter (Signed)
The patient's daughter is calling because she would like to accompany the patient to his OV with Dayna on 10/30.  He is wearing a monitor and she needs to assist with the technology, if needed.  Please advise, thank you.

## 2018-11-20 ENCOUNTER — Encounter: Payer: Self-pay | Admitting: Physician Assistant

## 2018-11-20 ENCOUNTER — Other Ambulatory Visit: Payer: Self-pay

## 2018-11-20 ENCOUNTER — Ambulatory Visit: Payer: Medicare Other | Admitting: Physician Assistant

## 2018-11-20 VITALS — BP 144/76 | HR 72 | Ht 68.5 in | Wt 176.6 lb

## 2018-11-20 DIAGNOSIS — R55 Syncope and collapse: Secondary | ICD-10-CM | POA: Diagnosis not present

## 2018-11-20 DIAGNOSIS — I453 Trifascicular block: Secondary | ICD-10-CM | POA: Diagnosis not present

## 2018-11-20 DIAGNOSIS — R001 Bradycardia, unspecified: Secondary | ICD-10-CM | POA: Diagnosis not present

## 2018-11-20 DIAGNOSIS — I739 Peripheral vascular disease, unspecified: Secondary | ICD-10-CM

## 2018-11-20 DIAGNOSIS — I10 Essential (primary) hypertension: Secondary | ICD-10-CM | POA: Diagnosis not present

## 2018-11-20 LAB — TSH: TSH: 2.12 u[IU]/mL (ref 0.450–4.500)

## 2018-11-20 MED ORDER — LISINOPRIL 40 MG PO TABS: 40.0000 mg | ORAL_TABLET | Freq: Every day | ORAL | 3 refills | Status: DC

## 2018-11-20 NOTE — Telephone Encounter (Signed)
Called pt's daughter, Lattie Haw back.  She was wanting to come to find out about pt's monitor.  She denies pt needing assistance to appointment. She was advised of the "no visitor policy" due to [id number] and was offered for pt to call her on his cell phone before the provider comes in for the appointment.  She was very understandable.

## 2018-11-20 NOTE — Patient Instructions (Signed)
Medication Instructions:  Your physician has recommended you make the following change in your medication:  1.  INCREASE the Lisinopril to 40 mg taking 1 tablet daily. You may use 2 of the 20 mg tablets at a time to use them up  *If you need a refill on your cardiac medications before your next appointment, please call your pharmacy*  Lab Work: TODAY:  TSH  If you have labs (blood work) drawn today and your tests are completely normal, you will receive your results only by: Marland Kitchen MyChart Message (if you have MyChart) OR . A paper copy in the mail If you have any lab test that is abnormal or we need to change your treatment, we will call you to review the results.  Testing/Procedures: None ordered  Follow-Up: At [facility], you and your health needs are our priority.  As part of our continuing mission to provide you with exceptional heart care, we have created designated Provider Care Teams.  These Care Teams include your primary Cardiologist (physician) and Advanced Practice Providers (APPs -  Physician Assistants and Nurse Practitioners) who all work together to provide you with the care you need, when you need it.  Your next appointment:   12/29/2018 11:00 a.m.  The format for your next appointment:   Virtual Visit  THIS Kevin [name] / PHONE CALL .. DO NOT COME IN THE OFFICE FOR THIS APPOINTMENT SEE INSTRUCTIONS BELOW:  Provider:   Melina Copa, PA-C  Other Instructions    Virtual Visit Pre-Appointment Phone Call  "(Name), I am calling you today to discuss your upcoming appointment. We are currently trying to limit exposure to the virus that causes COVID-19 by seeing patients at home rather than in the office."  1. "What is the BEST phone number to call the day of the visit?" - include this in appointment notes  2. "Do you have or have access to (through a family member/friend) a smartphone with video capability that we can use for your visit?" a. If yes  - list this number in appt notes as "cell" (if different from BEST phone #) and list the appointment type as a VIDEO visit in appointment notes b. If no - list the appointment type as a PHONE visit in appointment notes  3. Confirm consent - "In the setting of the current Covid19 crisis, you are scheduled for a (phone or video) visit with your provider on (date) at (time).  Just as we do with many in-office visits, in order for you to participate in this visit, we must obtain consent.  If you'd like, I can send this to your mychart (if signed up) or email for you to review.  Otherwise, I can obtain your verbal consent now.  All virtual visits are billed to your insurance company just like a normal visit would be.  By agreeing to a virtual visit, we'd like you to understand that the technology does not allow for your provider to perform an examination, and thus may limit your provider's ability to fully assess your condition. If your provider identifies any concerns that need to be evaluated in person, we will make arrangements to do so.  Finally, though the technology is pretty good, we cannot assure that it will always work on either your or our end, and in the setting of a video visit, we may have to convert it to a phone-only visit.  In either situation, we cannot ensure that we have a secure connection.  Are you willing to proceed?" STAFF: Did the patient verbally acknowledge consent to telehealth visit? Document YES/NO here: YES pt gave verbal consent on day of appointment   4. Advise patient to be prepared - "Two hours prior to your appointment, go ahead and check your blood pressure, pulse, oxygen saturation, and your weight (if you have the equipment to check those) and write them all down. When your visit starts, your provider will ask you for this information. If you have an Apple Watch or Kardia device, please plan to have heart rate information ready on the day of your appointment. Please have a pen  and paper handy nearby the day of the visit as well."  5. Give patient instructions for MyChart download to smartphone OR Doximity/Doxy.me as below if video visit (depending on what platform provider is using)  6. Inform patient they will receive a phone call 15 minutes prior to their appointment time (may be from unknown caller ID) so they should be prepared to answer    TELEPHONE CALL NOTE  Kevin [name] has been deemed a candidate for a follow-up tele-health visit to limit community exposure during the Covid-19 pandemic. I spoke with the patient via phone to ensure availability of phone/video source, confirm preferred email & phone number, and discuss instructions and expectations.  I reminded Kevin [name] to be prepared with any vital sign and/or heart rhythm information that could potentially be obtained via home monitoring, at the time of his visit. I reminded Kevin [name] to expect a phone call prior to his visit.  [name], RMA 11/20/2018 11:36 AM     DOXY.ME - The patient will receive a link just prior to their visit by text.  Click on that link and join video call.  If it asks you to "allow video/micropohone", make sure you hit allow.  Once you have done that, it will put you in virtual waiting room and we will see on our end when you are ready, then [name] will join with the video call and take care of your appointment.     FULL LENGTH CONSENT FOR TELE-HEALTH VISIT   I hereby voluntarily request, consent and authorize [facility] and its employed or contracted physicians, physician assistants, nurse practitioners or other licensed health care professionals (the Practitioner), to provide me with telemedicine health care services (the "Services") as deemed necessary by the treating Practitioner. I acknowledge and consent to receive the Services by the Practitioner via telemedicine. I understand that the telemedicine visit will involve communicating with the  Practitioner through live audiovisual communication technology and the disclosure of certain medical information by electronic transmission. I acknowledge that I have been given the opportunity to request an in-person assessment or other available alternative prior to the telemedicine visit and am voluntarily participating in the telemedicine visit.  I understand that I have the right to withhold or withdraw my consent to the use of telemedicine in the course of my care at any time, without affecting my right to future care or treatment, and that the Practitioner or I may terminate the telemedicine visit at any time. I understand that I have the right to inspect all information obtained and/or recorded in the course of the telemedicine visit and may receive copies of available information for a reasonable fee.  I understand that some of the potential risks of receiving the Services via telemedicine include:  Marland Kitchen Delay or interruption in medical evaluation due to technological equipment failure or disruption; .  Information transmitted may not be sufficient (e.g. poor resolution of images) to allow for appropriate medical decision making by the Practitioner; and/or  . In rare instances, security protocols could fail, causing a breach of personal health information.  Furthermore, I acknowledge that it is my responsibility to provide information about my medical history, conditions and care that is complete and accurate to the best of my ability. I acknowledge that Practitioner's advice, recommendations, and/or decision may be based on factors not within their control, such as incomplete or inaccurate data provided by me or distortions of diagnostic images or specimens that may result from electronic transmissions. I understand that the practice of medicine is not an exact science and that Practitioner makes no warranties or guarantees regarding treatment outcomes. I acknowledge that I will receive a copy of this  consent concurrently upon execution via email to the email address I last provided but may also request a printed copy by calling the office of [place].    I understand that my insurance will be billed for this visit.   I have read or had this consent read to me. . I understand the contents of this consent, which adequately explains the benefits and risks of the Services being provided via telemedicine.  . I have been provided ample opportunity to ask questions regarding this consent and the Services and have had my questions answered to my satisfaction. . I give my informed consent for the services to be provided through the use of telemedicine in my medical care  By participating in this telemedicine visit I agree to the above.

## 2018-11-23 ENCOUNTER — Telehealth: Payer: Self-pay | Admitting: Cardiology

## 2018-11-23 ENCOUNTER — Telehealth: Payer: Self-pay | Admitting: *Deleted

## 2018-11-23 NOTE — Telephone Encounter (Signed)
Patient's daughter calling in regards to lab results for her father on Friday 11/20/18

## 2018-11-23 NOTE — Telephone Encounter (Signed)
Returned call to daughter, Lattie Haw, [place] on file, and she has been made aware of lab results. See result note.

## 2018-11-23 NOTE — Telephone Encounter (Signed)
error 

## 2018-11-24 ENCOUNTER — Telehealth: Payer: Self-pay | Admitting: Physician Assistant

## 2018-11-24 NOTE — Telephone Encounter (Signed)
ERROR

## 2018-12-28 ENCOUNTER — Encounter: Payer: Self-pay | Admitting: Physician Assistant

## 2018-12-28 NOTE — Progress Notes (Addendum)
Virtual Visit via Video Note   This visit type was conducted due to national recommendations for restrictions regarding the COVID-19 Pandemic (e.g. social distancing) in an effort to limit this patient's exposure and mitigate transmission in our community.  Due to his co-morbid illnesses, this patient is at least at moderate risk for complications without adequate follow up.  This format is felt to be most appropriate for this patient at this time.  All issues noted in this document were discussed and addressed.  A limited physical exam was performed with this format.  Please refer to the patient's chart for his consent to telehealth for [facility]. Virtual platform was offered given ongoing worsening Covid-19 pandemic.  Date:  12/29/2018   ID:  Kevin [name], DOB [birth year], MRN [id number]  Patient Location: Home Provider Location: Home  PCP:  Hulan Fess, MD  Cardiologist:  Fransico Him, MD  Electrophysiologist:  None   Evaluation Performed:  Follow-Up Visit  Chief Complaint:  F/u event monitor  History of Present Illness:    Kevin [name] is a 83 y.o. male with thrombocytopenia, DM, HTN, GERD,PAD and chronic venous insufficiency (managed conservatively by Dr. Donzetta Matters), sinus bradycardia, trifascicular block, former tobacco use who presents for follow-up of testing.  Prior to recent evaluation he did not have any cardiac history diagnosed aside from what sounds like a chronic RBBB. The patient still works every day in the tool/die industry. He also cares for his wife (blind and had CVA with memory problems) with the help of his daughter who lives with them. He had been on atenolol for a long time and had noticed a HR in the 40s in the past but mostly 50s. He was admitted 10/2018 with pre-syncope described as multiple episodes of transient dizzy spells. Heart rate was in the high 30s in the ER. His atenolol (50mg  BID) was discontinued. HR remained stable throughout admission  without recurrent pre-syncope. Echo showed EF 55-60%, G2DD, trace MR, mild TR. Lisinopril was decreased from 30mg  BID to daily and HCTZ was held in case of component of hypotension. Outpatient 30 day monitor was placed. At last OV we increased lisinopril to 40mg  daily. Last labs 10/2018 TSH wnl, K 3.6, Cr 1.04, glucose 149, TBili 1.3, normal AST/ALT, Hgb 13.3, Plt 141. Event monitor placed. Final MD review showed sinus bradycardia, sinus arrhythmia, and NSR/sinus tach with average HR 76bpm and range 43-145bpm. There were occasional PACs/PVCs as well as brief sinus pauses with ventricular escape during the night. There is report of one of the strips that it represented atrial fib but episode was very brief and poor quality tracing, therefore not felt clinically significant at this time.  He is seen back virtually and feeling well. No pre-syncope, syncope, CP, SOB, edema, orthopnea or palpitations. No h/o stroke or ministroke noted. He has been out of work for a week as they had an exposure last week to Covid but he tested negative. He sees his PCP on Friday for a routine physical. Blood pressure has been running [id number] primarily systolic.  The patient does not have symptoms concerning for COVID-19 infection (fever, chills, cough, or new shortness of breath).    Past Medical History:  Diagnosis Date  . Blood dyscrasia    chronic thrombocytopenia  . Chronic venous insufficiency   . Diabetes mellitus type 2 in nonobese (HCC)   . GERD (gastroesophageal reflux disease)   . HLD (hyperlipidemia) 11/05/2018  . Hypertension   . PAD (peripheral artery disease) ([place])   .  Pre-syncope 11/05/2018  . Symptomatic bradycardia 11/03/2018   Past Surgical History:  Procedure Laterality Date  . CARPAL TUNNEL RELEASE Left 04/28/2015   Procedure: LEFT WRIST CARPAL TUNNEL RELEASE;  Surgeon: Melrose Nakayama, MD;  Location: [facility];  Service: Orthopedics;  Laterality: Left;  . SEPTOPLASTY        Current Meds  Medication Sig  . acetaminophen (TYLENOL) 500 MG tablet Take 500 mg by mouth every 6 (six) hours as needed for moderate pain.  Marland Kitchen aspirin 81 MG tablet Take 81 mg by mouth daily.  Marland Kitchen atorvastatin (LIPITOR) 20 MG tablet Take 20 mg by mouth daily.  Marland Kitchen docusate sodium (COLACE) 100 MG capsule Take 100 mg by mouth 2 (two) times daily.   Marland Kitchen latanoprost (XALATAN) 0.005 % ophthalmic solution Place 1 drop into both eyes at bedtime.  Marland Kitchen levothyroxine (SYNTHROID) 50 MCG tablet Take 50 mcg by mouth every morning.  Marland Kitchen lisinopril (ZESTRIL) 40 MG tablet Take 1 tablet (40 mg total) by mouth daily.  . metFORMIN (GLUCOPHAGE) 500 MG tablet Take 500 mg by mouth daily with breakfast.   . Multiple Vitamin (MULTIVITAMIN) capsule Take 1 capsule by mouth daily.  Marland Kitchen omeprazole (PRILOSEC) 20 MG capsule Take 20 mg by mouth daily.  . psyllium (METAMUCIL) 58.6 % powder Take 1 packet by mouth 2 (two) times daily.  . vitamin E 200 UNIT capsule Take 200 Units by mouth daily.     Allergies:   Patient has no known allergies.   Social History   Tobacco Use  . Smoking status: Former Smoker    Types: Cigarettes  . Smokeless tobacco: Former Network engineer Use Topics  . Alcohol use: No  . Drug use: No     Family Hx: The patient's family history includes Heart attack (age of onset: 81) in his mother; Stroke (age of onset: 29) in his father.  ROS:   Please see the history of present illness.    All other systems reviewed and are negative.   Prior CV studies:    Most recent pertinent cardiac studies are outlined above.  Labs/Other Tests and Data Reviewed:    EKG:  An ECG dated 11/20/18 was personally reviewed today and demonstrated:  NSR/sinus arrhythmia 66bpm, first degree AVB, RBBB, LAFB, minimal voltage criteria for LVH, nonspecific STT changes  Recent Labs: 11/03/2018: Hemoglobin 13.3; Platelets 141 11/04/2018: ALT 17; BUN 15; Creatinine, Ser 1.04; Potassium 3.6; Sodium 141 11/20/2018: TSH 2.120    Recent Lipid Panel No results found for: CHOL, TRIG, HDL, CHOLHDL, LDLCALC, LDLDIRECT  Wt Readings from Last 3 Encounters:  12/29/18 174 lb (78.9 kg)  11/20/18 176 lb 9.6 oz (80.1 kg)  11/05/18 168 lb 3.4 oz (76.3 kg)     Objective:    Vital Signs:  BP (!) 154/74   Pulse 64   Ht 5\' 9"  (1.753 m)   Wt 174 lb (78.9 kg)   BMI 25.70 kg/m    VS reviewed. General - calm M in no acute distress HEENT - NCAT, EOM intact Pulm - No labored breathing, no coughing during visit, no audible wheezing, speaking in full sentences Neuro - A+Ox3, no slurred speech, answers questions appropriately Psych - Pleasant affect     ASSESSMENT & PLAN:    1. Pre-syncope - likely related to symptomatic bradycardia. Resolved off atenolol. Discussed warning signs with patient 2. Trifascicular block with bradycardia - event monitor generally reassuring without prolonged pauses or sustained daytime bradycardia. There were nocturnal pauses. In the absence of  recurrent symptoms, there is no acute indication for PPM. We did discuss importance of early care for any recurrent symptoms as he would be at risk for worsening conduction disease in the future. I will review the mention of brief AF with Dr. Radford Pax to see if anything else is necessary but suspect we will continue to monitor clinically for now. Addendum: d/w Dr. Radford Pax - she agrees with plan. Given his nocturnal bradycardia with pauses and HTN she would suggest getting a home sleep test to exclude sleep apnea as a cause for this bradycardia. 3. Essential HTN - start amlodipine 5mg  daily. He will see his PCP for exam on Friday at which time BP can be reassessed. I offered a virtual OV in 1 week to check up on BP but he would prefer to discuss best plan with primary care at his OV later this week. Otherwise we can see him back in 6 months. I specifically chose not to restart diuretic so that we could avoid having to have him come in during the pandemic for repeat blood  draws to assess K/Cr.   COVID-19 Education: The signs and symptoms of COVID-19 were discussed with the patient and how to seek care for testing (follow up with PCP or arrange E-visit).  The importance of social distancing was discussed today.  Time:   Today, I have spent 15 minutes with the patient with telehealth technology discussing the above problems.     Medication Adjustments/Labs and Tests Ordered: Current medicines are reviewed at length with the patient today.  Concerns regarding medicines are outlined above.   Follow Up:  In office in 6 months with myself or Dr. Radford Pax  Signed, Charlie Pitter, PA-C  12/29/2018 11:15 AM    [place]

## 2018-12-29 ENCOUNTER — Other Ambulatory Visit: Payer: Self-pay

## 2018-12-29 ENCOUNTER — Encounter: Payer: Self-pay | Admitting: Physician Assistant

## 2018-12-29 ENCOUNTER — Telehealth: Payer: Self-pay

## 2018-12-29 ENCOUNTER — Telehealth (INDEPENDENT_AMBULATORY_CARE_PROVIDER_SITE_OTHER): Payer: Medicare Other | Admitting: Physician Assistant

## 2018-12-29 VITALS — BP 154/74 | HR 64 | Ht 69.0 in | Wt 174.0 lb

## 2018-12-29 DIAGNOSIS — R001 Bradycardia, unspecified: Secondary | ICD-10-CM

## 2018-12-29 DIAGNOSIS — I453 Trifascicular block: Secondary | ICD-10-CM

## 2018-12-29 DIAGNOSIS — R55 Syncope and collapse: Secondary | ICD-10-CM

## 2018-12-29 DIAGNOSIS — I10 Essential (primary) hypertension: Secondary | ICD-10-CM

## 2018-12-29 MED ORDER — AMLODIPINE BESYLATE 5 MG PO TABS: 5.0000 mg | ORAL_TABLET | Freq: Every day | ORAL | 3 refills | Status: DC

## 2018-12-29 NOTE — Addendum Note (Signed)
Addended by: Gaetano Net on: 12/29/2018 11:46 AM   Modules accepted: Orders

## 2018-12-29 NOTE — Patient Instructions (Signed)
Medication Instructions:  Your physician has recommended you make the following change in your medication:  1.  START Amlodipine 5 mg taking 1 tablet daily  *If you need a refill on your cardiac medications before your next appointment, please call your pharmacy*  Lab Work: None ordered  If you have labs (blood work) drawn today and your tests are completely normal, you will receive your results only by: Marland Kitchen MyChart Message (if you have MyChart) OR . A paper copy in the mail If you have any lab test that is abnormal or we need to change your treatment, we will call you to review the results.  Testing/Procedures: None ordered  Follow-Up: At [facility], you and your health needs are our priority.  As part of our continuing mission to provide you with exceptional heart care, we have created designated Provider Care Teams.  These Care Teams include your primary Cardiologist (physician) and Advanced Practice Providers (APPs -  Physician Assistants and Nurse Practitioners) who all work together to provide you with the care you need, when you need it.  Your next appointment:   6 month(s)  The format for your next appointment:   In Person  Provider:   Fransico Him, MD or Melina Copa, PA-C  Other Instructions  Keep appointment with PCP on Friday for physical and blood pressure check. Let us know if you would like [place] to follow it thereafter. Otherwise we will plan to see you back in 6 months.

## 2018-12-29 NOTE — Telephone Encounter (Signed)

## 2018-12-31 ENCOUNTER — Telehealth: Payer: Self-pay | Admitting: Physician Assistant

## 2018-12-31 DIAGNOSIS — I10 Essential (primary) hypertension: Secondary | ICD-10-CM

## 2018-12-31 DIAGNOSIS — R001 Bradycardia, unspecified: Secondary | ICD-10-CM

## 2018-12-31 NOTE — Telephone Encounter (Addendum)
   Please let pt know I reviewed his visit and event monitor with Dr. Radford Pax who agrees with our plan - no further monitoring needed at present time from heart standpoint, but notify us of any episodes of dizziness or palpitations. She did suggest getting a home sleep test to evaluate for OSA. I addended my office note to reflect this. Can you help arrange? Thanks. Krishan Mcbreen PA-C

## 2019-01-01 ENCOUNTER — Telehealth: Payer: Self-pay | Admitting: *Deleted

## 2019-01-01 NOTE — Telephone Encounter (Signed)
Pt returned my call and he has been made aware of Dr. Theodosia Blender recommendations re: sleep study. Pt understands someone will contact him once they hear back from his insurance company.

## 2019-01-01 NOTE — Telephone Encounter (Signed)
Call placed to pt re: phone note below. Left a message for pt to call back.  

## 2019-01-01 NOTE — Telephone Encounter (Signed)
Staff message sent to [employer] does not required PA for HST. Ok to schedule.

## 2019-01-01 NOTE — Telephone Encounter (Signed)
Staff message sent to [place] patient has [facility] insurance and it does not require a PA for HST. Ok to schedule.

## 2019-01-07 ENCOUNTER — Telehealth: Payer: Self-pay | Admitting: *Deleted

## 2019-01-07 NOTE — Telephone Encounter (Signed)
-----   Message from Lauralee Evener, [place] sent at 01/01/2019  1:27 PM EST ----- Regarding: RE: precert Patient has [employer]. Does not require PA for HST. ----- Message ----- From: Freada Bergeron, CMA Sent: 01/01/2019  11:16 AM EST To: Freada Bergeron, CMA, Cv Div Sleep Studies Subject: precert                                        Message Received: 2 days ago Message Contents  Antonieta Iba, RN  Freada Bergeron, CMA Patient is aware of monitor results, Dr. Radford Pax is recommending a home sleep study. Could you get this set up?  Thanks!

## 2019-01-07 NOTE — Telephone Encounter (Signed)
Patient is aware and agreeable to Home Sleep Study through [facility]. Patient is scheduled for 03/15/19 at 11 am to pick up home sleep kit and meet with Respiratory therapist at [facility]. Patient is aware that if this appointment date and time does not work for them they should contact Artis Delay directly at [phone]. Patient is aware that a sleep packet will be sent from [facility] in week. Patient is agreeable to treatment and thankful for call.

## 2019-02-12 ENCOUNTER — Ambulatory Visit: Payer: Medicare Other | Attending: Internal Medicine

## 2019-02-12 DIAGNOSIS — Z23 Encounter for immunization: Secondary | ICD-10-CM

## 2019-02-12 NOTE — Progress Notes (Signed)
   Covid-19 Vaccination Clinic  Name:  Kevin [name]    MRN: [id number] DOB: [birth year]  02/12/2019  Kevin [name] was observed post Covid-19 immunization for 15 minutes without incidence. He was provided with Vaccine Information Sheet and instruction to access the V-Safe system.   Kevin [name] was instructed to call 911 with any severe reactions post vaccine: Marland Kitchen Difficulty breathing  . Swelling of your face and throat  . A fast heartbeat  . A bad rash all over your body  . Dizziness and weakness    Immunizations Administered    Name Date Dose VIS Date Route   Pfizer COVID-19 Vaccine 02/12/2019  9:37 AM 0.3 mL 01/01/2019 Intramuscular   Manufacturer: [place]   Lot: [id number]   [place]: [id number]

## 2019-03-05 ENCOUNTER — Ambulatory Visit: Payer: Medicare Other | Attending: Internal Medicine

## 2019-03-05 DIAGNOSIS — Z23 Encounter for immunization: Secondary | ICD-10-CM | POA: Insufficient documentation

## 2019-03-05 NOTE — Progress Notes (Signed)
   Covid-19 Vaccination Clinic  Name:  Kevin [name]    MRN: [id number] DOB: [birth year]  03/05/2019  [name] was observed post Covid-19 immunization for 15 minutes without incidence. He was provided with Vaccine Information Sheet and instruction to access the V-Safe system.   Mr. Rocks was instructed to call 911 with any severe reactions post vaccine: Marland Kitchen Difficulty breathing  . Swelling of your face and throat  . A fast heartbeat  . A bad rash all over your body  . Dizziness and weakness    Immunizations Administered    Name Date Dose VIS Date Route   Pfizer COVID-19 Vaccine 03/05/2019 10:29 AM 0.3 mL 01/01/2019 Intramuscular   Manufacturer: [place]   Lot: [id number]   [place]: [id number]

## 2019-03-15 ENCOUNTER — Other Ambulatory Visit: Payer: Self-pay

## 2019-03-15 ENCOUNTER — Ambulatory Visit (HOSPITAL_BASED_OUTPATIENT_CLINIC_OR_DEPARTMENT_OTHER): Payer: Medicare Other

## 2019-03-31 ENCOUNTER — Other Ambulatory Visit: Payer: Self-pay

## 2019-03-31 ENCOUNTER — Ambulatory Visit (HOSPITAL_BASED_OUTPATIENT_CLINIC_OR_DEPARTMENT_OTHER): Payer: Medicare Other | Attending: Physician Assistant | Admitting: Cardiology

## 2019-03-31 DIAGNOSIS — R001 Bradycardia, unspecified: Secondary | ICD-10-CM | POA: Diagnosis not present

## 2019-03-31 DIAGNOSIS — G4733 Obstructive sleep apnea (adult) (pediatric): Secondary | ICD-10-CM

## 2019-03-31 DIAGNOSIS — I10 Essential (primary) hypertension: Secondary | ICD-10-CM | POA: Insufficient documentation

## 2019-03-31 DIAGNOSIS — R0683 Snoring: Secondary | ICD-10-CM | POA: Diagnosis not present

## 2019-03-31 DIAGNOSIS — R0902 Hypoxemia: Secondary | ICD-10-CM | POA: Diagnosis not present

## 2019-04-02 NOTE — Procedures (Signed)
   Patient Name: Kevin, [name] Date: 03/31/2019 Gender: Male D.O.B: 1935/12/18 Age (years): 60 Referring Provider: Melina Copa Height (inches): 3 Interpreting Physician: Fransico Him MD, ABSM Weight (lbs): 168 RPSGT: Jacolyn Reedy BMI: 26 MRN: [id number] Neck Size: 15.50  CLINICAL INFORMATION Sleep Study Type: HST  Indication for sleep study: Hypertension  Epworth Sleepiness Score: 6  SLEEP STUDY TECHNIQUE A multi-channel overnight portable sleep study was performed. The channels recorded were: nasal airflow, thoracic respiratory movement, and oxygen saturation with a pulse oximetry. Snoring was also monitored.  MEDICATIONS Patient self administered medications include: N/A.  SLEEP ARCHITECTURE Patient was studied for 363.4 minutes. The sleep efficiency was 98.4 % and the patient was supine for 27.4%. The arousal index was 0.0 per hour.  RESPIRATORY PARAMETERS The overall AHI was 9.6 per hour, with a central apnea index of 0.0 per hour.  The oxygen nadir was 81% during sleep.  CARDIAC DATA Mean heart rate during sleep was 63.3 bpm.  IMPRESSIONS - Mild obstructive sleep apnea occurred during this study (AHI = 9.6/h). - No significant central sleep apnea occurred during this study (CAI = 0.0/h). - Moderate oxygen desaturation was noted during this study (Min O2 = 81%). - Patient snored 0.1% during the sleep.  DIAGNOSIS - Obstructive Sleep Apnea (327.23 [G47.33 ICD-10]) - Nocturnal Hypoxemia (327.26 [G47.36 ICD-10])   RECOMMENDATIONS - Therapeutic CPAP titration to determine optimal pressure required to alleviate sleep disordered breathing. - Positional therapy avoiding supine position during sleep. - Oral appliance may be considered. - Avoid alcohol, sedatives and other CNS depressants that may worsen sleep apnea and disrupt normal sleep architecture. - Sleep hygiene should be reviewed to assess factors that may improve sleep quality. - Weight management  and regular exercise should be initiated or continued. - Return to Sleep Center to discuss the results of this study  [Electronically signed] 04/02/2019 06:16 PM  Fransico Him MD, ABSM Diplomate, American Board of Sleep Medicine

## 2019-04-06 ENCOUNTER — Telehealth: Payer: Self-pay | Admitting: *Deleted

## 2019-04-06 NOTE — Telephone Encounter (Signed)
-----   Message from Sueanne Margarita, MD sent at 04/02/2019  6:18 PM EST ----- Please let patient know that they have sleep apnea - please set up virtual OV with me to discuss

## 2019-04-06 NOTE — Telephone Encounter (Signed)
Informed patient of sleep study results. Patient understands his sleep study showed they have sleep apnea. Patient understands Dr Radford Pax wants to set up a virtual OV to discuss results and possible treatment options. Appointment set up for 05/13/19. Pt is aware and agreeable to the appointment.

## 2019-04-06 NOTE — Telephone Encounter (Signed)

## 2019-05-13 ENCOUNTER — Other Ambulatory Visit: Payer: Self-pay

## 2019-05-13 ENCOUNTER — Telehealth: Payer: Self-pay | Admitting: *Deleted

## 2019-05-13 ENCOUNTER — Encounter: Payer: Self-pay | Admitting: Cardiology

## 2019-05-13 ENCOUNTER — Telehealth (INDEPENDENT_AMBULATORY_CARE_PROVIDER_SITE_OTHER): Payer: Medicare Other | Admitting: Cardiology

## 2019-05-13 VITALS — BP 137/67 | HR 83 | Ht 68.0 in | Wt 176.0 lb

## 2019-05-13 DIAGNOSIS — I10 Essential (primary) hypertension: Secondary | ICD-10-CM

## 2019-05-13 DIAGNOSIS — I453 Trifascicular block: Secondary | ICD-10-CM | POA: Diagnosis not present

## 2019-05-13 DIAGNOSIS — R55 Syncope and collapse: Secondary | ICD-10-CM | POA: Diagnosis not present

## 2019-05-13 DIAGNOSIS — G4733 Obstructive sleep apnea (adult) (pediatric): Secondary | ICD-10-CM

## 2019-05-13 NOTE — Telephone Encounter (Signed)
Order placed to [facility] for Order ResMed auto CPAP from 4 to 18cm H2O with nasal pillow mask and chin strap and followup with me in 8 weeks.

## 2019-05-13 NOTE — Telephone Encounter (Addendum)
-----   Message from Sueanne Margarita, MD sent at 05/13/2019  3:04 PM EDT ----- Order ResMed auto CPAP from 4 to 18cm H2O with nasal pillow mask and chin strap via community message and followup with me in 8 weeks

## 2019-05-13 NOTE — Progress Notes (Signed)
Virtual Visit via Telephone Note   This visit type was conducted due to national recommendations for restrictions regarding the COVID-19 Pandemic (e.g. social distancing) in an effort to limit this patient's exposure and mitigate transmission in our community.  Due to his co-morbid illnesses, this patient is at least at moderate risk for complications without adequate follow up.  This format is felt to be most appropriate for this patient at this time.  The patient did not have access to video technology/had technical difficulties with video requiring transitioning to audio format only (telephone).  All issues noted in this document were discussed and addressed.  No physical exam could be performed with this format.  Please refer to the patient's chart for his  consent to telehealth for [facility].  Evaluation Performed:  Follow-up visit  This visit type was conducted due to national recommendations for restrictions regarding the COVID-19 Pandemic (e.g. social distancing).  This format is felt to be most appropriate for this patient at this time.  All issues noted in this document were discussed and addressed.  No physical exam was performed (except for noted visual exam findings with Video Visits).  Please refer to the patient's chart (MyChart message for video visits and phone note for telephone visits) for the patient's consent to telehealth for [facility].  Date:  05/13/2019   ID:  Kevin [name], DOB 08-22-1935, MRN [id number]  Patient Location:  Home  Provider location:   [place]  PCP:  Hulan Fess, MD  Cardiologist:  Fransico Him, MD  Electrophysiologist:  None   Chief Complaint:  OSA, HTN, sinus bradycardia  History of Present Illness:    Kevin [name] is a 84 y.o. male who presents via audio/video conferencing for a telehealth visit today.    Kevin [name] is a 84 y.o. male with thrombocytopenia, DM, HTN, GERD,PAD and chronic venous insufficiency (managed  conservatively by Dr. Donzetta Matters), sinus bradycardia, trifascicular block with chronic RBBB, former tobacco use. At last OV a home sleep study was ordered due to bradycardia and showed  mild OSA with  an AHI of 9.6/hr and O2 sats as low as 81%. He is now here for followup to discuss the results.   He is here today for followup and is doing well.  He denies any chest pain or pressure, SOB, DOE, PND, orthopnea, LE edema, dizziness, palpitations or syncope. He is compliant with his meds and is tolerating meds with no SE.  He tells me that he has no sleepiness during the day and sleeps well at night.  He is concerned about going on CPAP because he takes care of his wife who is blind and gets up 1-3 times nightly to take her to the bathroom.   The patient does not have symptoms concerning for COVID-19 infection (fever, chills, cough, or new shortness of breath).    Prior CV studies:   The following studies were reviewed today:  none  Past Medical History:  Diagnosis Date  . Blood dyscrasia    chronic thrombocytopenia  . Chronic venous insufficiency   . Diabetes mellitus type 2 in nonobese (HCC)   . GERD (gastroesophageal reflux disease)   . HLD (hyperlipidemia) 11/05/2018  . Hypertension   . PAD (peripheral artery disease) ([place])   . Pre-syncope 11/05/2018  . Symptomatic bradycardia 11/03/2018   Past Surgical History:  Procedure Laterality Date  . CARPAL TUNNEL RELEASE Left 04/28/2015   Procedure: LEFT WRIST CARPAL TUNNEL RELEASE;  Surgeon: Melrose Nakayama, MD;  Location: [place];  Service: Orthopedics;  Laterality: Left;  . SEPTOPLASTY       Current Meds  Medication Sig  . acetaminophen (TYLENOL) 500 MG tablet Take 500 mg by mouth in the morning and at bedtime.   Marland Kitchen amLODipine (NORVASC) 5 MG tablet Take 1 tablet (5 mg total) by mouth daily.  Marland Kitchen ascorbic acid (VITAMIN C) 500 MG tablet Take 500 mg by mouth daily.  Marland Kitchen aspirin 81 MG tablet Take 81 mg by mouth daily.  Marland Kitchen atorvastatin  (LIPITOR) 20 MG tablet Take 20 mg by mouth daily.  Marland Kitchen docusate sodium (COLACE) 100 MG capsule Take 100 mg by mouth 2 (two) times daily.   Marland Kitchen latanoprost (XALATAN) 0.005 % ophthalmic solution Place 1 drop into both eyes at bedtime.  Marland Kitchen levothyroxine (SYNTHROID) 50 MCG tablet Take 50 mcg by mouth every morning.  Marland Kitchen lisinopril (ZESTRIL) 40 MG tablet Take 1 tablet (40 mg total) by mouth daily.  . metFORMIN (GLUCOPHAGE) 500 MG tablet Take 500 mg by mouth daily with breakfast.   . Multiple Vitamin (MULTIVITAMIN) capsule Take 1 capsule by mouth daily.  Marland Kitchen omeprazole (PRILOSEC) 20 MG capsule Take 20 mg by mouth daily.  . psyllium (METAMUCIL) 58.6 % powder Take 1 packet by mouth 2 (two) times daily.  . vitamin E 200 UNIT capsule Take 200 Units by mouth daily.     Allergies:   Patient has no known allergies.   Social History   Tobacco Use  . Smoking status: Former Smoker    Types: Cigarettes  . Smokeless tobacco: Former Network engineer Use Topics  . Alcohol use: No  . Drug use: No     Family Hx: The patient's family history includes Heart attack (age of onset: 18) in his mother; Stroke (age of onset: 81) in his father.  ROS:   Please see the history of present illness.     All other systems reviewed and are negative.   Labs/Other Tests and Data Reviewed:    Recent Labs: 11/03/2018: Hemoglobin 13.3; Platelets 141 11/04/2018: ALT 17; BUN 15; Creatinine, Ser 1.04; Potassium 3.6; Sodium 141 11/20/2018: TSH 2.120   Recent Lipid Panel No results found for: CHOL, TRIG, HDL, CHOLHDL, LDLCALC, LDLDIRECT  Wt Readings from Last 3 Encounters:  05/13/19 176 lb (79.8 kg)  03/31/19 168 lb (76.2 kg)  03/15/19 168 lb (76.2 kg)     Objective:    Vital Signs:  BP 137/67   Pulse 83   Ht 5\' 8"  (1.727 m)   Wt 176 lb (79.8 kg)   BMI 26.76 kg/m     ASSESSMENT & PLAN:    1.  Pre-syncope -denies any reoccurrence of dizziness or syncope -felt related to symptomatic bradycardia which resolved  off BB  2.  Trifascicular block with chronic RBBB -even monitor with no prolonged pauses or sustained daytime bradycardia -he underwent home sleep study showing mild OSA with an AHI of 9.6/hr and O2 sats as low as 81%.    3.  HTN -BP controlled -continue amlodipine 5mg  daily  4.  OSA -he was found to have mild OSA with  an AHI of 9.6/hr and O2 sats as low as 81%.   -treatment options were discussed including avoiding sleeping supine vs. CPAP vs. Oral device -I will set him up on CPAP on auto CPAP from 4 to 18cm H2O with nasal pillow mask and chin strap and followup with me in 8 weeks  COVID-19 Education: The signs and symptoms of  COVID-19 were discussed with the patient and how to seek care for testing (follow up with PCP or arrange E-visit).  The importance of social distancing was discussed today.  Patient Risk:   After full review of this patient's clinical status, I feel that they are at least moderate risk at this time.  Time:   Today, I have spent 20 minutes on telemedicine discussing medical problems including OSA< HTN and reviewing patient's chart including sleep study.  Medication Adjustments/Labs and Tests Ordered: Current medicines are reviewed at length with the patient today.  Concerns regarding medicines are outlined above.  Tests Ordered: No orders of the defined types were placed in this encounter.  Medication Changes: No orders of the defined types were placed in this encounter.   Disposition:  Follow up in 8 week(s)  Signed, Fransico Him, MD  05/13/2019 2:55 PM    [place]

## 2019-06-14 NOTE — Telephone Encounter (Signed)
Upon patient request DME selection is [facility]. Patient understands HE will be contacted by [place] to set up HIS cpap. Patient understands to call if [facility] does not contact HIS with new setup in a timely manner. Patient understands they will be called once confirmation has been received from [facility] that they have received their new machine to schedule 10 week follow up appointment.  [facility] notified of new cpap order  Please add to airview Patient was grateful for the call and thanked me.

## 2019-06-14 NOTE — Telephone Encounter (Signed)
Patient has a 10 week follow up appointment scheduled for 08/13/19. Patient understands he needs to keep this appointment for insurance compliance. Patient was grateful for the call and thanked me.

## 2019-07-08 ENCOUNTER — Other Ambulatory Visit: Payer: Self-pay

## 2019-07-08 ENCOUNTER — Encounter: Payer: Self-pay | Admitting: Cardiology

## 2019-07-08 ENCOUNTER — Ambulatory Visit: Payer: Medicare Other | Admitting: Cardiology

## 2019-07-08 VITALS — BP 130/70 | HR 70 | Ht 68.5 in | Wt 178.2 lb

## 2019-07-08 DIAGNOSIS — G4733 Obstructive sleep apnea (adult) (pediatric): Secondary | ICD-10-CM

## 2019-07-08 DIAGNOSIS — I10 Essential (primary) hypertension: Secondary | ICD-10-CM | POA: Diagnosis not present

## 2019-07-08 NOTE — Progress Notes (Signed)
Date:  07/08/2019   ID:  [name], DOB 05-03-1935, MRN [id number]   PCP:  Hulan Fess, MD  Cardiologist:  Fransico Him, MD  Electrophysiologist:  None   Chief Complaint:  OSA, HTN, sinus bradycardia  History of Present Illness:    Kevin [name] is a 84 y.o. male with thrombocytopenia, DM, HTN, GERD,PAD and chronic venous insufficiency (managed conservatively by Dr. Donzetta Matters), sinus bradycardia, trifascicular block with chronic RBBB, former tobacco use. He also has mild OSA with  an AHI of 9.6/hr and O2 sats as low as 81%. When I saw him last we decided to treat his OSA with CPAP and he is now back for followup after getting on PAP therapy.    He is doing well with his CPAP device and thinks that he has gotten used to it.  He tolerates the mask and feels the pressure is adequate.  Since going on CPAP he feels rested in the am and has no significant daytime sleepiness.  He denies any significant mouth or nasal dryness or nasal congestion.  he does not think that he snores.     Prior CV studies:   The following studies were reviewed today:  PAP compliance download  Past Medical History:  Diagnosis Date  . Blood dyscrasia    chronic thrombocytopenia  . Chronic venous insufficiency   . Diabetes mellitus type 2 in nonobese (HCC)   . GERD (gastroesophageal reflux disease)   . HLD (hyperlipidemia) 11/05/2018  . Hypertension   . PAD (peripheral artery disease) ([place])   . Pre-syncope 11/05/2018  . Symptomatic bradycardia 11/03/2018   Past Surgical History:  Procedure Laterality Date  . CARPAL TUNNEL RELEASE Left 04/28/2015   Procedure: LEFT WRIST CARPAL TUNNEL RELEASE;  Surgeon: Melrose Nakayama, MD;  Location: [place];  Service: Orthopedics;  Laterality: Left;  . SEPTOPLASTY       Current Meds  Medication Sig  . acetaminophen (TYLENOL) 500 MG tablet Take 500 mg by mouth in the morning and at bedtime.   Marland Kitchen amLODipine (NORVASC) 5 MG tablet Take 1 tablet (5 mg  total) by mouth daily.  Marland Kitchen ascorbic acid (VITAMIN C) 500 MG tablet Take 500 mg by mouth daily.  Marland Kitchen aspirin 81 MG tablet Take 81 mg by mouth daily.  Marland Kitchen atorvastatin (LIPITOR) 20 MG tablet Take 20 mg by mouth daily.  Marland Kitchen docusate sodium (COLACE) 100 MG capsule Take 100 mg by mouth 2 (two) times daily.   . famotidine (PEPCID) 20 MG tablet Take 20 mg by mouth 2 (two) times daily.  . hydrochlorothiazide (MICROZIDE) 12.5 MG capsule Take 12.5 mg by mouth daily.  Marland Kitchen latanoprost (XALATAN) 0.005 % ophthalmic solution Place 1 drop into both eyes at bedtime.  Marland Kitchen levothyroxine (SYNTHROID) 50 MCG tablet Take 50 mcg by mouth every morning.  . metFORMIN (GLUCOPHAGE) 500 MG tablet Take 500 mg by mouth daily with breakfast.   . Multiple Vitamin (MULTIVITAMIN) capsule Take 1 capsule by mouth daily.  . psyllium (METAMUCIL) 58.6 % powder Take 1 packet by mouth 2 (two) times daily.  . vitamin E 200 UNIT capsule Take 200 Units by mouth daily.     Allergies:   Patient has no known allergies.   Social History   Tobacco Use  . Smoking status: Former Smoker    Types: Cigarettes  . Smokeless tobacco: Former Network engineer Use Topics  . Alcohol use: No  . Drug use: No     Family Hx: The patient's family  history includes Heart attack (age of onset: 30) in his mother; Stroke (age of onset: 74) in his father.  ROS:   Please see the history of present illness.     All other systems reviewed and are negative.   Labs/Other Tests and Data Reviewed:    Recent Labs: 11/03/2018: Hemoglobin 13.3; Platelets 141 11/04/2018: ALT 17; BUN 15; Creatinine, Ser 1.04; Potassium 3.6; Sodium 141 11/20/2018: TSH 2.120   Recent Lipid Panel No results found for: CHOL, TRIG, HDL, CHOLHDL, LDLCALC, LDLDIRECT  Wt Readings from Last 3 Encounters:  07/08/19 178 lb 3.2 oz (80.8 kg)  05/13/19 176 lb (79.8 kg)  03/31/19 168 lb (76.2 kg)     Objective:    Vital Signs:  BP 130/70   Pulse 70   Ht 5' 8.5" (1.74 m)   Wt 178 lb 3.2  oz (80.8 kg)   SpO2 97%   BMI 26.70 kg/m    GEN: Well nourished, well developed in no acute distress HEENT: Normal NECK: No JVD; No carotid bruits LYMPHATICS: No lymphadenopathy CARDIAC:RRR, no murmurs, rubs, gallops RESPIRATORY:  Clear to auscultation without rales, wheezing or rhonchi  ABDOMEN: Soft, non-tender, non-distended MUSCULOSKELETAL:  No edema; No deformity  SKIN: Warm and dry NEUROLOGIC:  Alert and oriented x 3 PSYCHIATRIC:  Normal affect   EKG was performed in the office today and showed NSR with RBBB, LVH by voltage  ASSESSMENT & PLAN:    1.  OSA -  The patient is tolerating PAP therapy well without any problems. The PAP download was reviewed today and showed an AHI of 0.9/hr on auto CPAP  with 100% compliance in using more than 4 hours nightly.  The patient has been using and benefiting from PAP use and will continue to benefit from therapy.   2.  HTN -BP well controlled on exam -continue amlodipine 5mg  daily   Medication Adjustments/Labs and Tests Ordered: Current medicines are reviewed at length with the patient today.  Concerns regarding medicines are outlined above.  Tests Ordered: No orders of the defined types were placed in this encounter.  Medication Changes: No orders of the defined types were placed in this encounter.   Disposition:  Follow up 1 year  Signed, Fransico Him, MD  07/08/2019 3:17 PM    [place]

## 2019-07-08 NOTE — Patient Instructions (Signed)
Medication Instructions:  Your physician recommends that you continue on your current medications as directed. Please refer to the Current Medication list given to you today.  *If you need a refill on your cardiac medications before your next appointment, please call your pharmacy*  Follow-Up: At [facility], you and your health needs are our priority.  As part of our continuing mission to provide you with exceptional heart care, we have created designated Provider Care Teams.  These Care Teams include your primary Cardiologist (physician) and Advanced Practice Providers (APPs -  Physician Assistants and Nurse Practitioners) who all work together to provide you with the care you need, when you need it.  We recommend signing up for the patient portal called "MyChart".  Sign up information is provided on this After Visit Summary.  MyChart is used to connect with patients for Virtual Visits (Telemedicine).  Patients are able to view lab/test results, encounter notes, upcoming appointments, etc.  Non-urgent messages can be sent to your provider as well.   To learn more about what you can do with MyChart, go to [email or web address].      

## 2019-08-13 ENCOUNTER — Telehealth (INDEPENDENT_AMBULATORY_CARE_PROVIDER_SITE_OTHER): Payer: Medicare Other | Admitting: Cardiology

## 2019-08-13 ENCOUNTER — Other Ambulatory Visit: Payer: Self-pay

## 2019-08-13 ENCOUNTER — Encounter: Payer: Self-pay | Admitting: Cardiology

## 2019-08-13 VITALS — BP 141/70 | HR 58 | Ht 68.5 in | Wt 174.0 lb

## 2019-08-13 DIAGNOSIS — I10 Essential (primary) hypertension: Secondary | ICD-10-CM

## 2019-08-13 DIAGNOSIS — G4733 Obstructive sleep apnea (adult) (pediatric): Secondary | ICD-10-CM

## 2019-08-13 NOTE — Progress Notes (Signed)
Virtual Visit via Telephone Note   This visit type was conducted due to national recommendations for restrictions regarding the COVID-19 Pandemic (e.g. social distancing) in an effort to limit this patient's exposure and mitigate transmission in our community.  Due to his co-morbid illnesses, this patient is at least at moderate risk for complications without adequate follow up.  This format is felt to be most appropriate for this patient at this time.  The patient did not have access to video technology/had technical difficulties with video requiring transitioning to audio format only (telephone).  All issues noted in this document were discussed and addressed.  No physical exam could be performed with this format.  Please refer to the patient's chart for his  consent to telehealth for [facility].  Evaluation Performed:  Follow-up visit  This visit type was conducted due to national recommendations for restrictions regarding the COVID-19 Pandemic (e.g. social distancing).  This format is felt to be most appropriate for this patient at this time.  All issues noted in this document were discussed and addressed.  No physical exam was performed (except for noted visual exam findings with Video Visits).  Please refer to the patient's chart (MyChart message for video visits and phone note for telephone visits) for the patient's consent to telehealth for [facility].  Date:  08/13/2019   ID:  Kevin [name], DOB 1935-02-22, MRN [id number]  Patient Location:  Home  Provider location:   [place]  PCP:  Hulan Fess, MD  Cardiologist:  Fransico Him, MD  Electrophysiologist:  None   Chief Complaint:  OSA, HTN  History of Present Illness:    Kevin [name] is a 84 y.o. male who presents via audio/video conferencing for a telehealth visit today.    Kevin [name] a 85 y.o.malewith thrombocytopenia, DM, HTN, GERD,PAD and chronic venous insufficiency (managed conservatively by Dr.  Donzetta Matters), sinus bradycardia, trifascicular block with chronic RBBB, former tobacco use. He also has mild OSA with  an AHI of 9.6/hr and O2 sats as low as 81%. He recently started on CPAP therapy and is now back for followup to see how he is doing.    He is doing well with his CPAP device and thinks that he has gotten used to it.  He tolerates the mask and feels the pressure is adequate.  Since going on CPAP he feels rested in the am and has no significant daytime sleepiness.  He denies any significant mouth or nasal dryness or nasal congestion.  He does not think that he snores.     The patient does not have symptoms concerning for COVID-19 infection (fever, chills, cough, or new shortness of breath).    Prior CV studies:   The following studies were reviewed today:  PAP compliance download  Past Medical History:  Diagnosis Date   Blood dyscrasia    chronic thrombocytopenia   Chronic venous insufficiency    Diabetes mellitus type 2 in nonobese (HCC)    GERD (gastroesophageal reflux disease)    HLD (hyperlipidemia) 11/05/2018   Hypertension    PAD (peripheral artery disease) (HCC)    Pre-syncope 11/05/2018   Symptomatic bradycardia 11/03/2018   Past Surgical History:  Procedure Laterality Date   CARPAL TUNNEL RELEASE Left 04/28/2015   Procedure: LEFT WRIST CARPAL TUNNEL RELEASE;  Surgeon: Melrose Nakayama, MD;  Location: [facility];  Service: Orthopedics;  Laterality: Left;   SEPTOPLASTY       Current Meds  Medication Sig  acetaminophen (TYLENOL) 500 MG tablet Take 500 mg by mouth in the morning and at bedtime.    ascorbic acid (VITAMIN C) 500 MG tablet Take 500 mg by mouth daily.   aspirin 81 MG tablet Take 81 mg by mouth daily.   atorvastatin (LIPITOR) 20 MG tablet Take 20 mg by mouth daily.   docusate sodium (COLACE) 100 MG capsule Take 100 mg by mouth 2 (two) times daily.    famotidine (PEPCID) 20 MG tablet Take 20 mg by mouth 2 (two) times daily.     hydrochlorothiazide (MICROZIDE) 12.5 MG capsule Take 12.5 mg by mouth daily.   latanoprost (XALATAN) 0.005 % ophthalmic solution Place 1 drop into both eyes at bedtime.   levothyroxine (SYNTHROID) 50 MCG tablet Take 50 mcg by mouth every morning.   lisinopril (ZESTRIL) 40 MG tablet Take 1 tablet (40 mg total) by mouth daily.   metFORMIN (GLUCOPHAGE) 500 MG tablet Take 500 mg by mouth daily with breakfast.    Multiple Vitamin (MULTIVITAMIN) capsule Take 1 capsule by mouth daily.   psyllium (METAMUCIL) 58.6 % powder Take 1 packet by mouth 2 (two) times daily.   vitamin E 200 UNIT capsule Take 200 Units by mouth daily.     Allergies:   Patient has no known allergies.   Social History   Tobacco Use   Smoking status: Former Smoker    Types: Cigarettes   Smokeless tobacco: Former Network engineer Use Topics   Alcohol use: No   Drug use: No     Family Hx: The patient's family history includes Heart attack (age of onset: 28) in his mother; Stroke (age of onset: 29) in his father.  ROS:   Please see the history of present illness.     All other systems reviewed and are negative.   Labs/Other Tests and Data Reviewed:    Recent Labs: 11/03/2018: Hemoglobin 13.3; Platelets 141 11/04/2018: ALT 17; BUN 15; Creatinine, Ser 1.04; Potassium 3.6; Sodium 141 11/20/2018: TSH 2.120   Recent Lipid Panel No results found for: CHOL, TRIG, HDL, CHOLHDL, LDLCALC, LDLDIRECT  Wt Readings from Last 3 Encounters:  08/13/19 174 lb (78.9 kg)  07/08/19 178 lb 3.2 oz (80.8 kg)  05/13/19 176 lb (79.8 kg)     Objective:    Vital Signs:  BP (!) 141/70    Pulse 58    Ht 5' 8.5" (1.74 m)    Wt 174 lb (78.9 kg)    BMI 26.07 kg/m     ASSESSMENT & PLAN:    1.  OSA -The patient is tolerating PAP therapy well without any problems. The PAP download was reviewed today and showed an AHI of 0.8/hr on auto PAP with 100% compliance in using more than 4 hours nightly.  The patient has been using  and benefiting from PAP use and will continue to benefit from therapy.   2.  HTN -BP controlled -continue amlodipine 5mg  daily, HCTZ 12.5mg  daily and Lisinopril 40mg  daily   COVID-19 Education: The signs and symptoms of COVID-19 were discussed with the patient and how to seek care for testing (follow up with PCP or arrange E-visit).  The importance of social distancing was discussed today.  Patient Risk:   After full review of this patient's clinical status, I feel that they are at least moderate risk at this time.  Time:   Today, I have spent 20 minutes on telemedicine discussing medical problems including OSA< HTN and reviewing patient's chart including PAP compliance download.  Medication Adjustments/Labs and Tests Ordered: Current medicines are reviewed at length with the patient today.  Concerns regarding medicines are outlined above.  Tests Ordered: No orders of the defined types were placed in this encounter.  Medication Changes: No orders of the defined types were placed in this encounter.   Disposition:  Follow up in 1 year(s)  Signed, Fransico Him, MD  08/13/2019 10:09 AM    [facility]

## 2020-01-28 DIAGNOSIS — G4733 Obstructive sleep apnea (adult) (pediatric): Secondary | ICD-10-CM | POA: Diagnosis not present

## 2020-01-28 DIAGNOSIS — E1169 Type 2 diabetes mellitus with other specified complication: Secondary | ICD-10-CM | POA: Diagnosis not present

## 2020-01-28 DIAGNOSIS — I10 Essential (primary) hypertension: Secondary | ICD-10-CM | POA: Diagnosis not present

## 2020-02-03 DIAGNOSIS — I10 Essential (primary) hypertension: Secondary | ICD-10-CM | POA: Diagnosis not present

## 2020-02-03 DIAGNOSIS — Z0000 Encounter for general adult medical examination without abnormal findings: Secondary | ICD-10-CM | POA: Diagnosis not present

## 2020-02-03 DIAGNOSIS — N289 Disorder of kidney and ureter, unspecified: Secondary | ICD-10-CM | POA: Diagnosis not present

## 2020-02-03 DIAGNOSIS — Z7984 Long term (current) use of oral hypoglycemic drugs: Secondary | ICD-10-CM | POA: Diagnosis not present

## 2020-02-03 DIAGNOSIS — H4089 Other specified glaucoma: Secondary | ICD-10-CM | POA: Diagnosis not present

## 2020-02-03 DIAGNOSIS — D6869 Other thrombophilia: Secondary | ICD-10-CM | POA: Diagnosis not present

## 2020-02-03 DIAGNOSIS — I739 Peripheral vascular disease, unspecified: Secondary | ICD-10-CM | POA: Diagnosis not present

## 2020-02-03 DIAGNOSIS — E1169 Type 2 diabetes mellitus with other specified complication: Secondary | ICD-10-CM | POA: Diagnosis not present

## 2020-02-03 DIAGNOSIS — E039 Hypothyroidism, unspecified: Secondary | ICD-10-CM | POA: Diagnosis not present

## 2020-02-03 DIAGNOSIS — R001 Bradycardia, unspecified: Secondary | ICD-10-CM | POA: Diagnosis not present

## 2020-02-16 DIAGNOSIS — E782 Mixed hyperlipidemia: Secondary | ICD-10-CM | POA: Diagnosis not present

## 2020-02-16 DIAGNOSIS — I10 Essential (primary) hypertension: Secondary | ICD-10-CM | POA: Diagnosis not present

## 2020-02-16 DIAGNOSIS — E039 Hypothyroidism, unspecified: Secondary | ICD-10-CM | POA: Diagnosis not present

## 2020-02-16 DIAGNOSIS — E1169 Type 2 diabetes mellitus with other specified complication: Secondary | ICD-10-CM | POA: Diagnosis not present

## 2020-02-16 DIAGNOSIS — H4089 Other specified glaucoma: Secondary | ICD-10-CM | POA: Diagnosis not present

## 2020-02-16 DIAGNOSIS — E1151 Type 2 diabetes mellitus with diabetic peripheral angiopathy without gangrene: Secondary | ICD-10-CM | POA: Diagnosis not present

## 2020-02-16 DIAGNOSIS — E119 Type 2 diabetes mellitus without complications: Secondary | ICD-10-CM | POA: Diagnosis not present

## 2020-02-16 DIAGNOSIS — R509 Fever, unspecified: Secondary | ICD-10-CM | POA: Diagnosis not present

## 2020-02-21 DIAGNOSIS — R109 Unspecified abdominal pain: Secondary | ICD-10-CM | POA: Diagnosis not present

## 2020-02-21 DIAGNOSIS — K5909 Other constipation: Secondary | ICD-10-CM | POA: Diagnosis not present

## 2020-02-22 ENCOUNTER — Other Ambulatory Visit: Payer: Self-pay | Admitting: Family Medicine

## 2020-02-22 DIAGNOSIS — R109 Unspecified abdominal pain: Secondary | ICD-10-CM

## 2020-02-23 ENCOUNTER — Ambulatory Visit
Admission: RE | Admit: 2020-02-23 | Discharge: 2020-02-23 | Disposition: A | Discharge status: Home / Self Care | Payer: Medicare Other | Source: Ambulatory Visit | Attending: Family Medicine | Admitting: Family Medicine

## 2020-02-23 ENCOUNTER — Encounter (HOSPITAL_COMMUNITY): Payer: Self-pay

## 2020-02-23 ENCOUNTER — Other Ambulatory Visit: Payer: Self-pay

## 2020-02-23 ENCOUNTER — Inpatient Hospital Stay (HOSPITAL_COMMUNITY)
Admission: EM | Admit: 2020-02-23 | Discharge: 2020-02-25 | DRG: 372 | Disposition: A | Discharge status: Home / Self Care | Payer: Medicare Other | Attending: General Surgery | Admitting: General Surgery

## 2020-02-23 DIAGNOSIS — Z87891 Personal history of nicotine dependence: Secondary | ICD-10-CM

## 2020-02-23 DIAGNOSIS — E1151 Type 2 diabetes mellitus with diabetic peripheral angiopathy without gangrene: Secondary | ICD-10-CM | POA: Diagnosis present

## 2020-02-23 DIAGNOSIS — D72819 Decreased white blood cell count, unspecified: Secondary | ICD-10-CM | POA: Diagnosis not present

## 2020-02-23 DIAGNOSIS — E039 Hypothyroidism, unspecified: Secondary | ICD-10-CM | POA: Diagnosis not present

## 2020-02-23 DIAGNOSIS — B999 Unspecified infectious disease: Secondary | ICD-10-CM

## 2020-02-23 DIAGNOSIS — Z79899 Other long term (current) drug therapy: Secondary | ICD-10-CM | POA: Diagnosis not present

## 2020-02-23 DIAGNOSIS — Z888 Allergy status to other drugs, medicaments and biological substances status: Secondary | ICD-10-CM | POA: Diagnosis not present

## 2020-02-23 DIAGNOSIS — I10 Essential (primary) hypertension: Secondary | ICD-10-CM | POA: Diagnosis not present

## 2020-02-23 DIAGNOSIS — K651 Peritoneal abscess: Principal | ICD-10-CM | POA: Diagnosis present

## 2020-02-23 DIAGNOSIS — Z79890 Hormone replacement therapy (postmenopausal): Secondary | ICD-10-CM

## 2020-02-23 DIAGNOSIS — Z823 Family history of stroke: Secondary | ICD-10-CM | POA: Diagnosis not present

## 2020-02-23 DIAGNOSIS — I453 Trifascicular block: Secondary | ICD-10-CM | POA: Diagnosis present

## 2020-02-23 DIAGNOSIS — K449 Diaphragmatic hernia without obstruction or gangrene: Secondary | ICD-10-CM | POA: Diagnosis present

## 2020-02-23 DIAGNOSIS — K219 Gastro-esophageal reflux disease without esophagitis: Secondary | ICD-10-CM | POA: Diagnosis present

## 2020-02-23 DIAGNOSIS — Z7982 Long term (current) use of aspirin: Secondary | ICD-10-CM

## 2020-02-23 DIAGNOSIS — K5909 Other constipation: Secondary | ICD-10-CM | POA: Diagnosis present

## 2020-02-23 DIAGNOSIS — R1031 Right lower quadrant pain: Secondary | ICD-10-CM | POA: Diagnosis not present

## 2020-02-23 DIAGNOSIS — R109 Unspecified abdominal pain: Secondary | ICD-10-CM

## 2020-02-23 DIAGNOSIS — Z20822 Contact with and (suspected) exposure to covid-19: Secondary | ICD-10-CM | POA: Diagnosis present

## 2020-02-23 DIAGNOSIS — Z800 Family history of malignant neoplasm of digestive organs: Secondary | ICD-10-CM

## 2020-02-23 DIAGNOSIS — I872 Venous insufficiency (chronic) (peripheral): Secondary | ICD-10-CM | POA: Diagnosis not present

## 2020-02-23 DIAGNOSIS — D72829 Elevated white blood cell count, unspecified: Secondary | ICD-10-CM | POA: Diagnosis not present

## 2020-02-23 DIAGNOSIS — G4733 Obstructive sleep apnea (adult) (pediatric): Secondary | ICD-10-CM | POA: Diagnosis present

## 2020-02-23 DIAGNOSIS — E785 Hyperlipidemia, unspecified: Secondary | ICD-10-CM | POA: Diagnosis not present

## 2020-02-23 DIAGNOSIS — R10823 Right lower quadrant rebound abdominal tenderness: Secondary | ICD-10-CM

## 2020-02-23 DIAGNOSIS — Z8249 Family history of ischemic heart disease and other diseases of the circulatory system: Secondary | ICD-10-CM

## 2020-02-23 DIAGNOSIS — R10813 Right lower quadrant abdominal tenderness: Secondary | ICD-10-CM | POA: Diagnosis not present

## 2020-02-23 LAB — COMPREHENSIVE METABOLIC PANEL
ALT: 22 U/L (ref 0–44)
AST: 20 U/L (ref 15–41)
Albumin: 3.4 g/dL — ABNORMAL LOW (ref 3.5–5.0)
Alkaline Phosphatase: 57 U/L (ref 38–126)
Anion gap: 13 (ref 5–15)
BUN: 20 mg/dL (ref 8–23)
CO2: 25 mmol/L (ref 22–32)
Calcium: 9.5 mg/dL (ref 8.9–10.3)
Chloride: 100 mmol/L (ref 98–111)
Creatinine, Ser: 1.08 mg/dL (ref 0.61–1.24)
GFR, Estimated: >60 mL/min (ref >60)
Glucose, Bld: 180 mg/dL — ABNORMAL HIGH (ref 70–99)
Potassium: 3.4 mmol/L — ABNORMAL LOW (ref 3.5–5.1)
Sodium: 138 mmol/L (ref 135–145)
Total Bilirubin: 0.6 mg/dL (ref 0.3–1.2)
Total Protein: 7 g/dL (ref 6.5–8.1)

## 2020-02-23 LAB — CBC
HCT: 37.9 % — ABNORMAL LOW (ref 39.0–52.0)
Hemoglobin: 12.9 g/dL — ABNORMAL LOW (ref 13.0–17.0)
MCH: 31.5 pg (ref 26.0–34.0)
MCHC: 34 g/dL (ref 30.0–36.0)
MCV: 92.7 fL (ref 80.0–100.0)
Platelets: 247 10*3/uL (ref 150–400)
RBC: 4.09 MIL/uL — ABNORMAL LOW (ref 4.22–5.81)
RDW: 12.4 % (ref 11.5–15.5)
WBC: 9.1 10*3/uL (ref 4.0–10.5)
nRBC: 0 % (ref 0.0–0.2)

## 2020-02-23 MED ORDER — SODIUM CHLORIDE 0.9 % IV SOLN
2.0000 g | Freq: Every day | INTRAVENOUS | Status: DC
  Administered 2020-02-24 (×2): 2 g via INTRAVENOUS
  Filled 2020-02-23: qty 20
  Filled 2020-02-23: qty 2
  Filled 2020-02-23: qty 20

## 2020-02-23 MED ORDER — HYDROCHLOROTHIAZIDE 12.5 MG PO CAPS
12.5000 mg | ORAL_CAPSULE | Freq: Every day | ORAL | Status: DC
  Administered 2020-02-24 – 2020-02-25 (×2): 12.5 mg via ORAL
  Filled 2020-02-23 (×2): qty 1

## 2020-02-23 MED ORDER — ONDANSETRON HCL 4 MG/2ML IJ SOLN: 4.0000 mg | Freq: Four times a day (QID) | INTRAMUSCULAR | Status: DC | PRN

## 2020-02-23 MED ORDER — ONDANSETRON 4 MG PO TBDP: 4.0000 mg | ORAL_TABLET | Freq: Four times a day (QID) | ORAL | Status: DC | PRN

## 2020-02-23 MED ORDER — FAMOTIDINE 20 MG PO TABS
20.0000 mg | ORAL_TABLET | Freq: Two times a day (BID) | ORAL | Status: DC
  Administered 2020-02-24 – 2020-02-25 (×3): 20 mg via ORAL
  Filled 2020-02-23 (×3): qty 1

## 2020-02-23 MED ORDER — METRONIDAZOLE IN NACL 5-0.79 MG/ML-% IV SOLN
500.0000 mg | Freq: Three times a day (TID) | INTRAVENOUS | Status: DC
  Administered 2020-02-24 – 2020-02-25 (×5): 500 mg via INTRAVENOUS
  Filled 2020-02-23 (×5): qty 100

## 2020-02-23 MED ORDER — INSULIN ASPART 100 UNIT/ML ~~LOC~~ SOLN: 0.0000 [IU] | Freq: Three times a day (TID) | SUBCUTANEOUS | Status: DC

## 2020-02-23 MED ORDER — IOPAMIDOL (ISOVUE-300) INJECTION 61%
100.0000 mL | Freq: Once | INTRAVENOUS | Status: AC | PRN
  Administered 2020-02-23: 100 mL via INTRAVENOUS

## 2020-02-23 MED ORDER — ENOXAPARIN SODIUM 40 MG/0.4ML ~~LOC~~ SOLN
40.0000 mg | SUBCUTANEOUS | Status: DC
  Administered 2020-02-24: 40 mg via SUBCUTANEOUS
  Filled 2020-02-23: qty 0.4

## 2020-02-23 MED ORDER — LACTATED RINGERS IV SOLN: INTRAVENOUS | Status: DC

## 2020-02-23 MED ORDER — DOCUSATE SODIUM 100 MG PO CAPS
100.0000 mg | ORAL_CAPSULE | Freq: Two times a day (BID) | ORAL | Status: DC
  Administered 2020-02-24 – 2020-02-25 (×3): 100 mg via ORAL
  Filled 2020-02-23 (×3): qty 1

## 2020-02-23 MED ORDER — LATANOPROST 0.005 % OP SOLN
1.0000 [drp] | Freq: Every day | OPHTHALMIC | Status: DC
  Filled 2020-02-23: qty 2.5

## 2020-02-23 MED ORDER — ATORVASTATIN CALCIUM 10 MG PO TABS
20.0000 mg | ORAL_TABLET | Freq: Every day | ORAL | Status: DC
  Administered 2020-02-24 – 2020-02-25 (×2): 20 mg via ORAL
  Filled 2020-02-23 (×2): qty 2

## 2020-02-23 MED ORDER — ASPIRIN 81 MG PO CHEW
81.0000 mg | CHEWABLE_TABLET | Freq: Every day | ORAL | Status: DC
  Administered 2020-02-24 – 2020-02-25 (×2): 81 mg via ORAL
  Filled 2020-02-23 (×2): qty 1

## 2020-02-23 MED ORDER — METRONIDAZOLE IN NACL 5-0.79 MG/ML-% IV SOLN: 500.0000 mg | Freq: Three times a day (TID) | INTRAVENOUS | Status: DC

## 2020-02-23 MED ORDER — CIPROFLOXACIN IN D5W 400 MG/200ML IV SOLN: 400.0000 mg | Freq: Two times a day (BID) | INTRAVENOUS | Status: DC

## 2020-02-23 MED ORDER — LEVOTHYROXINE SODIUM 50 MCG PO TABS
50.0000 ug | ORAL_TABLET | Freq: Every day | ORAL | Status: DC
  Administered 2020-02-24 – 2020-02-25 (×2): 50 ug via ORAL
  Filled 2020-02-23 (×2): qty 1

## 2020-02-23 MED ORDER — TRAMADOL HCL 50 MG PO TABS: 50.0000 mg | ORAL_TABLET | Freq: Four times a day (QID) | ORAL | Status: DC | PRN

## 2020-02-23 MED ORDER — ACETAMINOPHEN 325 MG PO TABS
650.0000 mg | ORAL_TABLET | Freq: Four times a day (QID) | ORAL | Status: DC | PRN
  Administered 2020-02-25: 650 mg via ORAL
  Filled 2020-02-23: qty 2

## 2020-02-23 NOTE — ED Triage Notes (Signed)
Patient here following CT that showed acute diverticulitis. Reports pain RLQ for several days with constipation. Patient alert and oriented, NAD

## 2020-02-23 NOTE — H&P (Incomplete)
Kevin [name] Jul 20, 1935  [id number].    Chief Complaint/Reason for Consult: abdominal pain, RLQ abscess  HPI:  Mr. Kevin [name] is an 85 yo male with a history of HTN, HLD, DM, and GERD who presented with abdominal pain. He has chronic constipation but about 2 weeks ago had very severe constipation, and has since had intermittent right lower quadrant abdominal pain. A few days ago he had loss of appetite and fever to 101. He was seen by his PCP and had labs done which per his report showed an elevated WBC. He also had a COVID test which was negative. He was sent for a CT scan which was done this morning, and showed inflammation around the cecum and appendix as well as concern for an abscess. He was sent to the ED. In the ED he is afebrile and hemodynamically stable. WBC is normal and labs are unremarkable. He still has pain and discomfort in the RLQ.  Patient reports his last colonoscopy was 2 years ago (at [facility]) and he had a polyp removed, which he was told was precancerous. He was recommended to have another colonoscopy in 3 years. He has a brother with colon cancer. He has ongoing constipation but denies any blood in his stools.  ROS: Review of Systems  Constitutional: Positive for chills and fever.  Eyes: Negative for redness.  Respiratory: Negative for shortness of breath, wheezing and stridor.   Cardiovascular: Negative for chest pain.  Gastrointestinal: Positive for abdominal pain and constipation. Negative for blood in stool, nausea and vomiting.  Musculoskeletal: Negative for falls.  Skin: Negative for rash.  Neurological: Negative for speech change and focal weakness.  Psychiatric/Behavioral: Negative for memory loss.    Family History  Problem Relation Age of Onset  . Heart attack Mother 79  . Stroke Father 55    Past Medical History:  Diagnosis Date  . Blood dyscrasia    chronic thrombocytopenia  . Chronic venous insufficiency   . Diabetes mellitus type 2 in  nonobese (HCC)   . GERD (gastroesophageal reflux disease)   . HLD (hyperlipidemia) 11/05/2018  . Hypertension   . PAD (peripheral artery disease) ([place])   . Pre-syncope 11/05/2018  . Symptomatic bradycardia 11/03/2018    Past Surgical History:  Procedure Laterality Date  . CARPAL TUNNEL RELEASE Left 04/28/2015   Procedure: LEFT WRIST CARPAL TUNNEL RELEASE;  Surgeon: Melrose Nakayama, MD;  Location: [facility];  Service: Orthopedics;  Laterality: Left;  . SEPTOPLASTY      Social History:  reports that he has quit smoking. His smoking use included cigarettes. He has quit using smokeless tobacco. He reports that he does not drink alcohol and does not use drugs.  Allergies: No Known Allergies  (Not in a hospital admission)    Physical Exam: Blood pressure 120/63, pulse 67, temperature 98.3 F (36.8 C), temperature source Oral, resp. rate 18, height 5' 8.5" (1.74 m), weight 79.4 kg, SpO2 98 %. General: resting comfortably, appears stated age, no apparent distress Neurological: alert and oriented, no focal deficits, cranial nerves grossly in tact HEENT: normocephalic, atraumatic, oropharynx clear, no scleral icterus CV:  extremities warm and well-perfused Respiratory: normal work of breathing on room air, symmetric chest wall expansion Abdomen: soft, nondistended, focally tender to palpation in the RLQ with voluntary guarding. No masses or organomegaly. No surgical scars. Extremities: warm and well-perfused, no deformities, moving all extremities spontaneously Psychiatric: normal mood and affect Skin: warm and dry, no jaundice, no rashes or  lesions  Results for orders placed or performed during the hospital encounter of 02/23/20 (from the past 48 hour(s))  Comprehensive metabolic panel     Status: Abnormal   Collection Time: 02/23/20  5:57 PM  Result Value Ref Range   Sodium 138 135 - 145 mmol/L   Potassium 3.4 (L) 3.5 - 5.1 mmol/L   Chloride 100 98 - 111 mmol/L   CO2  25 22 - 32 mmol/L   Glucose, Bld 180 (H) 70 - 99 mg/dL    Comment: Glucose reference range applies only to samples taken after fasting for at least 8 hours.   BUN 20 8 - 23 mg/dL   Creatinine, Ser 1.08 0.61 - 1.24 mg/dL   Calcium 9.5 8.9 - 10.3 mg/dL   Total Protein 7.0 6.5 - 8.1 g/dL   Albumin 3.4 (L) 3.5 - 5.0 g/dL   AST 20 15 - 41 U/L   ALT 22 0 - 44 U/L   Alkaline Phosphatase 57 38 - 126 U/L   Total Bilirubin 0.6 0.3 - 1.2 mg/dL   GFR, Estimated >60 >60 mL/min    Comment: (NOTE) Calculated using the CKD-EPI Creatinine Equation (2021)    Anion gap 13 5 - 15    Comment: Performed at [facility] [address], [place], [place] [address]  CBC     Status: Abnormal   Collection Time: 02/23/20  5:57 PM  Result Value Ref Range   WBC 9.1 4.0 - 10.5 K/uL   RBC 4.09 (L) 4.22 - 5.81 MIL/uL   Hemoglobin 12.9 (L) 13.0 - 17.0 g/dL   HCT 37.9 (L) 39.0 - 52.0 %   MCV 92.7 80.0 - 100.0 fL   MCH 31.5 26.0 - 34.0 pg   MCHC 34.0 30.0 - 36.0 g/dL   RDW 12.4 11.5 - 15.5 %   Platelets 247 150 - 400 K/uL   nRBC 0.0 0.0 - 0.2 %    Comment: Performed at [facility], [place] [address], [place], [place] [address]   CT ABDOMEN PELVIS W CONTRAST  Result Date: 02/23/2020 CLINICAL DATA:  85 year old male with history of right lower quadrant abdominal pain. Elevated white blood cell count for 1 week. Evaluate for diverticulitis or other etiology. EXAM: CT ABDOMEN AND PELVIS WITH CONTRAST TECHNIQUE: Multidetector CT imaging of the abdomen and pelvis was performed using the standard protocol following bolus administration of intravenous contrast. CONTRAST:  198mL ISOVUE-300 IOPAMIDOL (ISOVUE-300) INJECTION 61% COMPARISON:  No priors. FINDINGS: Lower chest: Scattered areas of scarring in the visualize lung bases. Atherosclerotic calcifications in the descending thoracic aorta as well as the right coronary artery. Small hiatal hernia. Hepatobiliary: No suspicious cystic or solid hepatic lesions. No  intra or extrahepatic biliary ductal dilatation. Gallbladder is normal in appearance. Pancreas: No pancreatic mass. No pancreatic ductal dilatation. No pancreatic or peripancreatic fluid collections or inflammatory changes. Spleen: Unremarkable. Adrenals/Urinary Tract: Bilateral kidneys and adrenal glands are normal in appearance. No hydroureteronephrosis. Urinary bladder is normal in appearance. Stomach/Bowel: Normal appearance of the stomach. No pathologic dilatation of small bowel or colon. Vascular/Lymphatic: In the right lower quadrant intimately associated with the medial wall of the cecum in close proximity to the orifice of the appendix there is a 3.8 x 2.4 x 4.3 cm low-attenuation rim enhancing collection (axial image 51 of series 2 and coronal image 53 of series 3) with surrounding inflammatory changes in the adjacent ileocolic fat. The wall of the cecum adjacent to this is thickened. The appendix traverses  adjacent to this lesion, however, the remaining portions of the appendix are otherwise normal in size and appearance. A few scattered colonic diverticulae are noted, most evident in the descending colon and proximal sigmoid colon, without definite surrounding inflammatory changes. Reproductive: Prostate gland and seminal vesicles are unremarkable in appearance. Other: No significant volume of ascites.  No pneumoperitoneum. Musculoskeletal: There are no aggressive appearing lytic or blastic lesions noted in the visualized portions of the skeleton. IMPRESSION: 1. Large low-attenuation rim enhancing structure intimately associated with the medial wall of the cecum with surrounding inflammatory changes. This is closely associated with, but appears separate from the appendix. Overall, this is concerning for intramural abscess in the wall of the cecum, however, the possibility of underlying neoplasm is difficult to entirely exclude. Surgical consultation is strongly recommended in the immediate future. 2.  Colonic diverticulosis predominantly in the left side of the colon without definitive findings to suggest an acute diverticulitis at this time. 3. Aortic atherosclerosis. 4. Small hiatal hernia. Critical Value/emergent results were called by telephone at the time of interpretation on 02/23/2020 at 11:34 am to provider [facility], who verbally acknowledged these results. Electronically Signed   By: Vinnie Langton M.D.   On: 02/23/2020 11:34      Assessment/Plan 85 yo male presenting with about 2 weeks of RLQ abdominal pain and intermittent fevers. I personally reviewed his imaging. There is stranding adjacent to the cecum and appendix as well as a possible fluid collection. The appendix itself does not appear dilated. No obvious diverticuli in the right colon. There does not appear to be a drainable collection. Differential includes    FEN - *** VTE - *** ID - *** Admit - ***  Michaelle Birks, MD [facility] Surgery General, Hepatobiliary and Pancreatic Surgery 02/23/20 11:52 PM

## 2020-02-23 NOTE — H&P (Signed)
Kevin [name] Jul 20, 1935  [id number].    Chief Complaint/Reason for Consult: abdominal pain, RLQ abscess  HPI:  Kevin [name] is an 85 yo male with a history of HTN, HLD, DM, and GERD who presented with abdominal pain. He has chronic constipation but about 2 weeks ago had very severe constipation, and has since had intermittent right lower quadrant abdominal pain. A few days ago he had loss of appetite and fever to 101. He was seen by his PCP and had labs done which per his report showed an elevated WBC. He also had a COVID test which was negative. He was sent for a CT scan which was done this morning, and showed inflammation around the cecum and appendix as well as concern for an abscess. He was sent to the ED. In the ED he is afebrile and hemodynamically stable. WBC is normal and labs are unremarkable. He still has pain and discomfort in the RLQ.  Patient reports his last colonoscopy was 2 years ago (at [facility]) and he had a polyp removed, which he was told was precancerous. He was recommended to have another colonoscopy in 3 years. He has a brother with colon cancer. He has ongoing constipation but denies any blood in his stools.  ROS: Review of Systems  Constitutional: Positive for chills and fever.  Eyes: Negative for redness.  Respiratory: Negative for shortness of breath, wheezing and stridor.   Cardiovascular: Negative for chest pain.  Gastrointestinal: Positive for abdominal pain and constipation. Negative for blood in stool, nausea and vomiting.  Musculoskeletal: Negative for falls.  Skin: Negative for rash.  Neurological: Negative for speech change and focal weakness.  Psychiatric/Behavioral: Negative for memory loss.    Family History  Problem Relation Age of Onset  . Heart attack Mother 79  . Stroke Father 55    Past Medical History:  Diagnosis Date  . Blood dyscrasia    chronic thrombocytopenia  . Chronic venous insufficiency   . Diabetes mellitus type 2 in  nonobese (HCC)   . GERD (gastroesophageal reflux disease)   . HLD (hyperlipidemia) 11/05/2018  . Hypertension   . PAD (peripheral artery disease) ([place])   . Pre-syncope 11/05/2018  . Symptomatic bradycardia 11/03/2018    Past Surgical History:  Procedure Laterality Date  . CARPAL TUNNEL RELEASE Left 04/28/2015   Procedure: LEFT WRIST CARPAL TUNNEL RELEASE;  Surgeon: Melrose Nakayama, MD;  Location: [facility];  Service: Orthopedics;  Laterality: Left;  . SEPTOPLASTY      Social History:  reports that he has quit smoking. His smoking use included cigarettes. He has quit using smokeless tobacco. He reports that he does not drink alcohol and does not use drugs.  Allergies: No Known Allergies  (Not in a hospital admission)    Physical Exam: Blood pressure 120/63, pulse 67, temperature 98.3 F (36.8 C), temperature source Oral, resp. rate 18, height 5' 8.5" (1.74 m), weight 79.4 kg, SpO2 98 %. General: resting comfortably, appears stated age, no apparent distress Neurological: alert and oriented, no focal deficits, cranial nerves grossly in tact HEENT: normocephalic, atraumatic, oropharynx clear, no scleral icterus CV:  extremities warm and well-perfused Respiratory: normal work of breathing on room air, symmetric chest wall expansion Abdomen: soft, nondistended, focally tender to palpation in the RLQ with voluntary guarding. No masses or organomegaly. No surgical scars. Extremities: warm and well-perfused, no deformities, moving all extremities spontaneously Psychiatric: normal mood and affect Skin: warm and dry, no jaundice, no rashes or  lesions  Results for orders placed or performed during the hospital encounter of 02/23/20 (from the past 48 hour(s))  Comprehensive metabolic panel     Status: Abnormal   Collection Time: 02/23/20  5:57 PM  Result Value Ref Range   Sodium 138 135 - 145 mmol/L   Potassium 3.4 (L) 3.5 - 5.1 mmol/L   Chloride 100 98 - 111 mmol/L   CO2  25 22 - 32 mmol/L   Glucose, Bld 180 (H) 70 - 99 mg/dL    Comment: Glucose reference range applies only to samples taken after fasting for at least 8 hours.   BUN 20 8 - 23 mg/dL   Creatinine, Ser 1.08 0.61 - 1.24 mg/dL   Calcium 9.5 8.9 - 10.3 mg/dL   Total Protein 7.0 6.5 - 8.1 g/dL   Albumin 3.4 (L) 3.5 - 5.0 g/dL   AST 20 15 - 41 U/L   ALT 22 0 - 44 U/L   Alkaline Phosphatase 57 38 - 126 U/L   Total Bilirubin 0.6 0.3 - 1.2 mg/dL   GFR, Estimated >60 >60 mL/min    Comment: (NOTE) Calculated using the CKD-EPI Creatinine Equation (2021)    Anion gap 13 5 - 15    Comment: Performed at [facility] [address], [place], [place] [address]  CBC     Status: Abnormal   Collection Time: 02/23/20  5:57 PM  Result Value Ref Range   WBC 9.1 4.0 - 10.5 K/uL   RBC 4.09 (L) 4.22 - 5.81 MIL/uL   Hemoglobin 12.9 (L) 13.0 - 17.0 g/dL   HCT 37.9 (L) 39.0 - 52.0 %   MCV 92.7 80.0 - 100.0 fL   MCH 31.5 26.0 - 34.0 pg   MCHC 34.0 30.0 - 36.0 g/dL   RDW 12.4 11.5 - 15.5 %   Platelets 247 150 - 400 K/uL   nRBC 0.0 0.0 - 0.2 %    Comment: Performed at [facility], [place] [address], [place], [place] [address]   CT ABDOMEN PELVIS W CONTRAST  Result Date: 02/23/2020 CLINICAL DATA:  85 year old male with history of right lower quadrant abdominal pain. Elevated white blood cell count for 1 week. Evaluate for diverticulitis or other etiology. EXAM: CT ABDOMEN AND PELVIS WITH CONTRAST TECHNIQUE: Multidetector CT imaging of the abdomen and pelvis was performed using the standard protocol following bolus administration of intravenous contrast. CONTRAST:  198mL ISOVUE-300 IOPAMIDOL (ISOVUE-300) INJECTION 61% COMPARISON:  No priors. FINDINGS: Lower chest: Scattered areas of scarring in the visualize lung bases. Atherosclerotic calcifications in the descending thoracic aorta as well as the right coronary artery. Small hiatal hernia. Hepatobiliary: No suspicious cystic or solid hepatic lesions. No  intra or extrahepatic biliary ductal dilatation. Gallbladder is normal in appearance. Pancreas: No pancreatic mass. No pancreatic ductal dilatation. No pancreatic or peripancreatic fluid collections or inflammatory changes. Spleen: Unremarkable. Adrenals/Urinary Tract: Bilateral kidneys and adrenal glands are normal in appearance. No hydroureteronephrosis. Urinary bladder is normal in appearance. Stomach/Bowel: Normal appearance of the stomach. No pathologic dilatation of small bowel or colon. Vascular/Lymphatic: In the right lower quadrant intimately associated with the medial wall of the cecum in close proximity to the orifice of the appendix there is a 3.8 x 2.4 x 4.3 cm low-attenuation rim enhancing collection (axial image 51 of series 2 and coronal image 53 of series 3) with surrounding inflammatory changes in the adjacent ileocolic fat. The wall of the cecum adjacent to this is thickened. The appendix traverses  adjacent to this lesion, however, the remaining portions of the appendix are otherwise normal in size and appearance. A few scattered colonic diverticulae are noted, most evident in the descending colon and proximal sigmoid colon, without definite surrounding inflammatory changes. Reproductive: Prostate gland and seminal vesicles are unremarkable in appearance. Other: No significant volume of ascites.  No pneumoperitoneum. Musculoskeletal: There are no aggressive appearing lytic or blastic lesions noted in the visualized portions of the skeleton. IMPRESSION: 1. Large low-attenuation rim enhancing structure intimately associated with the medial wall of the cecum with surrounding inflammatory changes. This is closely associated with, but appears separate from the appendix. Overall, this is concerning for intramural abscess in the wall of the cecum, however, the possibility of underlying neoplasm is difficult to entirely exclude. Surgical consultation is strongly recommended in the immediate future. 2.  Colonic diverticulosis predominantly in the left side of the colon without definitive findings to suggest an acute diverticulitis at this time. 3. Aortic atherosclerosis. 4. Small hiatal hernia. Critical Value/emergent results were called by telephone at the time of interpretation on 02/23/2020 at 11:34 am to provider [facility], who verbally acknowledged these results. Electronically Signed   By: Vinnie Langton M.D.   On: 02/23/2020 11:34      Assessment/Plan 85 yo male presenting with about 2 weeks of RLQ abdominal pain and intermittent fevers. I personally reviewed his imaging. There is stranding adjacent to the cecum and appendix as well as a possible fluid collection. The appendix itself does not appear dilated. There does not appear to be a collection amenable to drainage. Specific diagnosis is unclear but the differential includes appendicitis, diverticulitis, or underlying malignancy. The patient is clinically stable but given imaging findings and focal tenderness on exam, will admit him and treat with antibiotics. If symptoms improve with antibiotics he can likely be discharged tomorrow and will need outpatient colonoscopy for further workup. - Begin rocephin and flagyl - NPO midnight in case of need for intervention if symptoms worsen - Maintenance IV fluids - Home medications as appropriate - VTE: lovenox, SCDs - Dispo: admit to observation  Michaelle Birks, MD [facility] Surgery General, Hepatobiliary and Pancreatic Surgery 02/23/20 11:52 PM

## 2020-02-23 NOTE — ED Provider Notes (Signed)
[facility] EMERGENCY DEPARTMENT Provider Note   CSN: [id number] Arrival date & time: 02/23/20  1658     History No chief complaint on file.   Kevin [name] is a 85 y.o. male with history of diabetes mellitus type 2, HLD, HTN, PAD, chronic thrombocytopenia, chronic venous insufficiency, hypothyroidism, OSA on CPAP, sinus bradycardia, trifascicular block with chronic RBBB, and former tobacco use who presents the emergency department with a chief complaint of abdominal pain.  The patient reports that he developed constipation about a week and a half ago (1/22) that resolved with over-the-counter medication.  The following day, he developed a fever, Tmax ~101, lasted for approximately 24 hours accompanied by diffuse abdominal pain.  He then had several days of low-grade fever and chills. Three days later (1/26), the patient continued to feel poorly and was seen by his PCP who ordered a COVID-19 test.  The following day, he developed nonradiating, intermittent right lower quadrant pain that he is unable to characterize.  Pain has been coming and going since onset.  Pain is worse with direct pressure and palpation, but no other known aggravating or alleviating factors.  He followed up with his PCP on 1/31-had a his COVID-19 test was negative serial basic labs were ordered.  He received a call from his yesterday that his white blood cell count was elevated and was sent for a CT of his abdomen and pelvis this morning.  He received a call from his PCPs office this afternoon that the CT was abnormal and he should come to the emergency department for further work-up and evaluation.  He denies nausea, vomiting, diarrhea, dysuria, hematuria, flank pain, penile or testicular pain or swelling, unexplained weight loss, night sweats, cough, chest pain, shortness of breath, numbness, and weakness.  He did have 1 day of anorexia, but this is since resolved.  Constipation has also resolved since he  initiated over-the-counter medication.  He has no history of abdominal surgery.  His last colonoscopy was 2 years ago with [facility] and he had a polyp removed and is scheduled for repeat colonoscopy next year.  His brother was diagnosed with colorectal cancer in his early 20s.  The history is provided by the patient and medical records. No language interpreter was used.       Past Medical History:  Diagnosis Date   Blood dyscrasia    chronic thrombocytopenia   Chronic venous insufficiency    Diabetes mellitus type 2 in nonobese (HCC)    GERD (gastroesophageal reflux disease)    HLD (hyperlipidemia) 11/05/2018   Hypertension    PAD (peripheral artery disease) (HCC)    Pre-syncope 11/05/2018   Symptomatic bradycardia 11/03/2018    Patient Active Problem List   Diagnosis Date Noted   Abscess of abdominal cavity ([place]) 02/23/2020   Pre-syncope 11/05/2018   HLD (hyperlipidemia) 11/05/2018   Symptomatic bradycardia 11/03/2018   Hypertension    Diabetes mellitus without complication [facility])     Past Surgical History:  Procedure Laterality Date   CARPAL TUNNEL RELEASE Left 04/28/2015   Procedure: LEFT WRIST CARPAL TUNNEL RELEASE;  Surgeon: Melrose Nakayama, MD;  Location: [facility];  Service: Orthopedics;  Laterality: Left;   SEPTOPLASTY         Family History  Problem Relation Age of Onset   Heart attack Mother 26   Stroke Father 97    Social History   Tobacco Use   Smoking status: Former Smoker    Types: Cigarettes  Smokeless tobacco: Former Network engineer Use Topics   Alcohol use: No   Drug use: No    Home Medications Prior to Admission medications   Medication Sig Start Date End Date Taking? Authorizing Provider  acetaminophen (TYLENOL) 500 MG tablet Take 500 mg by mouth in the morning and at bedtime.   Yes [provider]  ascorbic acid (VITAMIN C) 500 MG tablet Take 500 mg by mouth daily.   Yes [provider]  aspirin 81 MG tablet Take 81 mg by mouth daily.   Yes [provider]  atorvastatin (LIPITOR) 20 MG tablet Take 20 mg by mouth at bedtime.   Yes [provider]  docusate sodium (COLACE) 100 MG capsule Take 200 mg by mouth in the morning and at bedtime.   Yes [provider]  famotidine (PEPCID) 20 MG tablet Take 20 mg by mouth 2 (two) times daily.   Yes [provider]  hydrochlorothiazide (MICROZIDE) 12.5 MG capsule Take 12.5 mg by mouth daily. 05/05/19  Yes [provider]  latanoprost (XALATAN) 0.005 % ophthalmic solution Place 1 drop into both eyes at bedtime.   Yes [provider]  levothyroxine (SYNTHROID) 50 MCG tablet Take 50 mcg by mouth every morning. 11/11/18  Yes [provider]  lisinopril (ZESTRIL) 40 MG tablet Take 1 tablet (40 mg total) by mouth daily. 11/20/18 08/13/19 Yes Dunn, Dayna N, PA-C  metFORMIN (GLUCOPHAGE) 500 MG tablet Take 500 mg by mouth daily with breakfast.    Yes [provider]  Multiple Vitamin (MULTIVITAMIN) capsule Take 1 capsule by mouth daily.   Yes [provider]  psyllium (METAMUCIL) 58.6 % powder Take 1 packet by mouth 2 (two) times daily.   Yes [provider]  vitamin E 200 UNIT capsule Take 200 Units by mouth daily.   Yes [provider]  amLODipine (NORVASC) 5 MG tablet Take 1 tablet (5 mg total) by mouth daily. Patient not taking: No sig reported 12/29/18 07/08/19  Charlie Pitter, PA-C    Allergies    Amlodipine and Metoprolol succinate [metoprolol]  Review of Systems   Review of Systems  Constitutional: Positive for chills (resolved) and fever (resolved). Negative for appetite change.       Malaise  HENT: Negative for congestion and sore throat.   Respiratory: Negative for cough, shortness of breath and wheezing.   Cardiovascular: Negative for chest pain, palpitations and leg swelling.  Gastrointestinal: Positive for abdominal pain and  constipation (resolved). Negative for abdominal distention, blood in stool, diarrhea, nausea and vomiting.  Genitourinary: Negative for dysuria, frequency, penile pain, penile swelling and urgency.  Musculoskeletal: Negative for back pain, myalgias, neck pain and neck stiffness.  Skin: Negative for rash.  Allergic/Immunologic: Negative for immunocompromised state.  Neurological: Negative for seizures, syncope, weakness, numbness and headaches.  Psychiatric/Behavioral: Negative for confusion.    Physical Exam Updated Vital Signs BP (!) 96/49 (BP Location: Right Arm)    Pulse (!) 58    Temp 98.3 F (36.8 C) (Oral)    Resp 20    Ht 5' 8.5" (1.74 m)    Wt 79.4 kg    SpO2 99%    BMI 26.22 kg/m   Physical Exam Vitals and nursing note reviewed.  Constitutional:      General: He is not in acute distress.    Appearance: He is well-developed. He is not ill-appearing, toxic-appearing or diaphoretic.     Comments: Nontoxic.  Well-appearing.  No acute distress.  HENT:     Head: Normocephalic.  Eyes:     Conjunctiva/sclera: Conjunctivae normal.  Cardiovascular:     Rate and Rhythm: Normal rate and regular rhythm.     Pulses: Normal pulses.     Heart sounds: Normal heart sounds. No murmur heard. No friction rub. No gallop.   Pulmonary:     Effort: Pulmonary effort is normal.     Breath sounds: No stridor. No wheezing, rhonchi or rales.  Chest:     Chest wall: No tenderness.  Abdominal:     General: There is no distension.     Palpations: Abdomen is soft. There is no mass.     Tenderness: There is abdominal tenderness. There is rebound. There is no right CVA tenderness, left CVA tenderness or guarding.     Hernia: No hernia is present.     Comments: Focally tender to palpation in the right lower quadrant with localized rebound tenderness.  No guarding.  No tenderness to palpation over McBurney's point.  Negative Rovsing sign.  Negative Murphy sign.  Normoactive bowel sounds.  No CVA  tenderness bilaterally.  Abdomen is soft and nondistended.  Musculoskeletal:     Cervical back: Neck supple.     Right lower leg: No edema.     Left lower leg: No edema.  Skin:    General: Skin is warm and dry.  Neurological:     Mental Status: He is alert.  Psychiatric:        Behavior: Behavior normal.     ED Results / Procedures / Treatments   Labs (all labs ordered are listed, but only abnormal results are displayed) Labs Reviewed  COMPREHENSIVE METABOLIC PANEL - Abnormal; Notable for the following components:      Result Value   Potassium 3.4 (*)    Glucose, Bld 180 (*)    Albumin 3.4 (*)    All other components within normal limits  CBC - Abnormal; Notable for the following components:   RBC 4.09 (*)    Hemoglobin 12.9 (*)    HCT 37.9 (*)    All other components within normal limits  CULTURE, BLOOD (ROUTINE X 2)  CULTURE, BLOOD (ROUTINE X 2)  SARS CORONAVIRUS 2 BY RT PCR (HOSPITAL ORDER, [facility])  LACTIC ACID, PLASMA  LACTIC ACID, PLASMA  CBC    EKG None  Radiology CT ABDOMEN PELVIS W CONTRAST  Result Date: 02/23/2020 CLINICAL DATA:  85 year old male with history of right lower quadrant abdominal pain. Elevated white blood cell count for 1 week. Evaluate for diverticulitis or other etiology. EXAM: CT ABDOMEN AND PELVIS WITH CONTRAST TECHNIQUE: Multidetector CT imaging of the abdomen and pelvis was performed using the standard protocol following bolus administration of intravenous contrast. CONTRAST:  163mL ISOVUE-300 IOPAMIDOL (ISOVUE-300) INJECTION 61% COMPARISON:  No priors. FINDINGS: Lower chest: Scattered areas of scarring in the visualize lung bases. Atherosclerotic calcifications in the descending thoracic aorta as well as the right coronary artery. Small hiatal hernia. Hepatobiliary: No suspicious cystic or solid hepatic lesions. No intra or extrahepatic biliary ductal dilatation. Gallbladder is normal in appearance. Pancreas: No  pancreatic mass. No pancreatic ductal dilatation. No pancreatic or peripancreatic fluid collections or inflammatory changes. Spleen: Unremarkable. Adrenals/Urinary Tract: Bilateral kidneys and adrenal glands are normal in appearance. No hydroureteronephrosis. Urinary bladder is normal in appearance. Stomach/Bowel: Normal appearance of the stomach. No pathologic dilatation of small bowel or colon. Vascular/Lymphatic: In the right lower quadrant intimately associated with the medial wall of  the cecum in close proximity to the orifice of the appendix there is a 3.8 x 2.4 x 4.3 cm low-attenuation rim enhancing collection (axial image 51 of series 2 and coronal image 53 of series 3) with surrounding inflammatory changes in the adjacent ileocolic fat. The wall of the cecum adjacent to this is thickened. The appendix traverses adjacent to this lesion, however, the remaining portions of the appendix are otherwise normal in size and appearance. A few scattered colonic diverticulae are noted, most evident in the descending colon and proximal sigmoid colon, without definite surrounding inflammatory changes. Reproductive: Prostate gland and seminal vesicles are unremarkable in appearance. Other: No significant volume of ascites.  No pneumoperitoneum. Musculoskeletal: There are no aggressive appearing lytic or blastic lesions noted in the visualized portions of the skeleton. IMPRESSION: 1. Large low-attenuation rim enhancing structure intimately associated with the medial wall of the cecum with surrounding inflammatory changes. This is closely associated with, but appears separate from the appendix. Overall, this is concerning for intramural abscess in the wall of the cecum, however, the possibility of underlying neoplasm is difficult to entirely exclude. Surgical consultation is strongly recommended in the immediate future. 2. Colonic diverticulosis predominantly in the left side of the colon without definitive findings to  suggest an acute diverticulitis at this time. 3. Aortic atherosclerosis. 4. Small hiatal hernia. Critical Value/emergent results were called by telephone at the time of interpretation on 02/23/2020 at 11:34 am to provider [facility], who verbally acknowledged these results. Electronically Signed   By: Vinnie Langton M.D.   On: 02/23/2020 11:34    Procedures Procedures   Medications Ordered in ED Medications  enoxaparin (LOVENOX) injection 40 mg (has no administration in time range)  lactated ringers infusion (has no administration in time range)  cefTRIAXone (ROCEPHIN) 2 g in sodium chloride 0.9 % 100 mL IVPB (has no administration in time range)    And  metroNIDAZOLE (FLAGYL) IVPB 500 mg (500 mg Intravenous New Bag/Given 02/24/20 0101)  docusate sodium (COLACE) capsule 100 mg (has no administration in time range)  ondansetron (ZOFRAN-ODT) disintegrating tablet 4 mg (has no administration in time range)    Or  ondansetron (ZOFRAN) injection 4 mg (has no administration in time range)  acetaminophen (TYLENOL) tablet 650 mg (has no administration in time range)  traMADol (ULTRAM) tablet 50 mg (has no administration in time range)  aspirin chewable tablet 81 mg (has no administration in time range)  atorvastatin (LIPITOR) tablet 20 mg (has no administration in time range)  famotidine (PEPCID) tablet 20 mg (has no administration in time range)  hydrochlorothiazide (MICROZIDE) capsule 12.5 mg (has no administration in time range)  latanoprost (XALATAN) 0.005 % ophthalmic solution 1 drop (has no administration in time range)  levothyroxine (SYNTHROID) tablet 50 mcg (has no administration in time range)  insulin aspart (novoLOG) injection 0-15 Units (has no administration in time range)    ED Course  I have reviewed the triage vital signs and the nursing notes.  Pertinent labs & imaging results that were available during my care of the patient were reviewed by me and considered in my  medical decision making (see chart for details).  Clinical Course as of 02/24/20 0113  Wed Feb 23, 2020  2208 Spoke with Dr. Zenia Resides, general surgery, who has reviewed the patient's CT and examined the patient at bedside.  She does not see an obvious drainable collection on CT.  She request that the patient be n.p.o. after midnight and would like to start  the patient on ciprofloxacin and Flagyl.  Surgery will admit the patient.  COVID-19 test has been ordered.  Since symptoms have been ongoing for a week and a half, will obtain blood cultures prior to antibiotic administration.  No other surgical recommendations at this time.  Surgery consult appreciated. [MM]    Clinical Course User Index [MM] Clarine Elrod, Laymond Purser, PA-C   MDM Rules/Calculators/A&P                          85 year old male with history of diabetes mellitus type 2, HLD, HTN, PAD, chronic thrombocytopenia, chronic venous insufficiency, hypothyroidism, OSA on CPAP, sinus bradycardia, trifascicular block with chronic RBBB, and former tobacco use who presents the emergency department from home after having an abnormal outpatient CT performed earlier today of his abdomen and pelvis.  He had presented to primary care multiple times over the last week and a half after he developed constipation, followed by several days of fever and chills that have since resolved.  Differential diagnosis includes diverticulitis, appendicitis, UTI, obstructive uropathy, COVID-19, mesenteric ischemia, infectious or inflammatory colitis, or bowel obstruction.  In the ER, vital signs are stable.  He is afebrile.  No tachycardia and he is normotensive.  On exam, he is focally tender in the right lower quadrant with localized rebound tenderness.  No guarding.  He has no tenderness over McBurney's point and has a negative Rovsing sign.  Physical exam is otherwise reassuring and he is nontoxic-appearing and stable.  Labs and imaging have been reviewed and independently  interpreted by me.  The patient was seen and independently evaluated by Dr. Leonette Monarch, attending physician.  He reportedly had a leukocytosis on outpatient labs performed 2 days ago, but these labs are not available to me in care everywhere.  He was not initiated on antibiotics.  Today, he has no leukocytosis.  No metabolic derangements.  Will order blood cultures x2 and lactate since patient has been symptomatic for more than a week and a half and there is concern for intra-abdominal infection on his outpatient CT, which I personally reviewed.  CT was concerning for intramural abscess with associated stranding versus malignancy.  Appendix appeared unremarkable and there was no evidence of diverticulitis.  Spoke with Dr. Zenia Resides, general surgery who will accept the patient for admission.  Consults appreciated.  Ciprofloxacin and Flagyl have been initiated per general surgery recommendations.  He will be n.p.o. after midnight and COVID-19 test is pending.  Lactate is also pending at this time.  The patient appears reasonably stabilized for admission considering the current resources, flow, and capabilities available in the ED at this time, and I doubt any other [facility] requiring further screening and/or treatment in the ED prior to admission.    Final Clinical Impression(s) / ED Diagnoses Final diagnoses:  Right lower quadrant abdominal tenderness with rebound tenderness  Intra-abdominal infection    Rx / DC Orders ED Discharge Orders    None       Joanne Gavel, PA-C 02/24/20 0113    Fatima Blank, MD 02/24/20 [phone]

## 2020-02-24 ENCOUNTER — Encounter (HOSPITAL_COMMUNITY): Payer: Self-pay

## 2020-02-24 DIAGNOSIS — E039 Hypothyroidism, unspecified: Secondary | ICD-10-CM | POA: Diagnosis present

## 2020-02-24 DIAGNOSIS — Z888 Allergy status to other drugs, medicaments and biological substances status: Secondary | ICD-10-CM | POA: Diagnosis not present

## 2020-02-24 DIAGNOSIS — I872 Venous insufficiency (chronic) (peripheral): Secondary | ICD-10-CM | POA: Diagnosis present

## 2020-02-24 DIAGNOSIS — E1151 Type 2 diabetes mellitus with diabetic peripheral angiopathy without gangrene: Secondary | ICD-10-CM | POA: Diagnosis present

## 2020-02-24 DIAGNOSIS — R1031 Right lower quadrant pain: Secondary | ICD-10-CM | POA: Diagnosis present

## 2020-02-24 DIAGNOSIS — Z823 Family history of stroke: Secondary | ICD-10-CM | POA: Diagnosis not present

## 2020-02-24 DIAGNOSIS — I10 Essential (primary) hypertension: Secondary | ICD-10-CM | POA: Diagnosis present

## 2020-02-24 DIAGNOSIS — I453 Trifascicular block: Secondary | ICD-10-CM | POA: Diagnosis present

## 2020-02-24 DIAGNOSIS — K5909 Other constipation: Secondary | ICD-10-CM | POA: Diagnosis present

## 2020-02-24 DIAGNOSIS — Z87891 Personal history of nicotine dependence: Secondary | ICD-10-CM | POA: Diagnosis not present

## 2020-02-24 DIAGNOSIS — Z7982 Long term (current) use of aspirin: Secondary | ICD-10-CM | POA: Diagnosis not present

## 2020-02-24 DIAGNOSIS — G4733 Obstructive sleep apnea (adult) (pediatric): Secondary | ICD-10-CM | POA: Diagnosis present

## 2020-02-24 DIAGNOSIS — K219 Gastro-esophageal reflux disease without esophagitis: Secondary | ICD-10-CM | POA: Diagnosis present

## 2020-02-24 DIAGNOSIS — K651 Peritoneal abscess: Secondary | ICD-10-CM | POA: Diagnosis present

## 2020-02-24 DIAGNOSIS — K449 Diaphragmatic hernia without obstruction or gangrene: Secondary | ICD-10-CM | POA: Diagnosis present

## 2020-02-24 DIAGNOSIS — Z79899 Other long term (current) drug therapy: Secondary | ICD-10-CM | POA: Diagnosis not present

## 2020-02-24 DIAGNOSIS — E785 Hyperlipidemia, unspecified: Secondary | ICD-10-CM | POA: Diagnosis present

## 2020-02-24 DIAGNOSIS — Z8249 Family history of ischemic heart disease and other diseases of the circulatory system: Secondary | ICD-10-CM | POA: Diagnosis not present

## 2020-02-24 DIAGNOSIS — Z79890 Hormone replacement therapy (postmenopausal): Secondary | ICD-10-CM | POA: Diagnosis not present

## 2020-02-24 DIAGNOSIS — Z20822 Contact with and (suspected) exposure to covid-19: Secondary | ICD-10-CM | POA: Diagnosis present

## 2020-02-24 DIAGNOSIS — Z800 Family history of malignant neoplasm of digestive organs: Secondary | ICD-10-CM | POA: Diagnosis not present

## 2020-02-24 LAB — CBG MONITORING, ED
Glucose-Capillary: 99 mg/dL (ref 70–99)
Glucose-Capillary: 110 mg/dL — ABNORMAL HIGH (ref 70–99)

## 2020-02-24 LAB — CBC
HCT: 38 % — ABNORMAL LOW (ref 39.0–52.0)
Hemoglobin: 12.3 g/dL — ABNORMAL LOW (ref 13.0–17.0)
MCH: 30.8 pg (ref 26.0–34.0)
MCHC: 32.4 g/dL (ref 30.0–36.0)
MCV: 95 fL (ref 80.0–100.0)
Platelets: 224 10*3/uL (ref 150–400)
RBC: 4 MIL/uL — ABNORMAL LOW (ref 4.22–5.81)
RDW: 12.4 % (ref 11.5–15.5)
WBC: 8.3 10*3/uL (ref 4.0–10.5)
nRBC: 0 % (ref 0.0–0.2)

## 2020-02-24 LAB — SARS CORONAVIRUS 2 BY RT PCR (HOSPITAL ORDER, PERFORMED IN ~~LOC~~ HOSPITAL LAB): SARS Coronavirus 2: NEGATIVE

## 2020-02-24 LAB — LACTIC ACID, PLASMA
Lactic Acid, Venous: 1.7 mmol/L (ref 0.5–1.9)
Lactic Acid, Venous: 1.2 mmol/L (ref 0.5–1.9)

## 2020-02-24 LAB — GLUCOSE, CAPILLARY
Glucose-Capillary: 97 mg/dL (ref 70–99)
Glucose-Capillary: 89 mg/dL (ref 70–99)

## 2020-02-24 NOTE — ED Notes (Signed)
Pt is laying in the bed .Kevin [name] Pt expresses no complaints at this time.

## 2020-02-24 NOTE — Progress Notes (Signed)
    CC: Abdominal pain  Subjective: Patient reports he has had pain in the right lower quadrant for about 2 weeks.  He says it feels slightly better this a.m. but still has some pain on light palpation. He just got to a room in the ED from the hall.  Objective: Vital signs in last 24 hours: Temp:  [98.3 F (36.8 C)-98.8 F (37.1 C)] 98.3 F (36.8 C) (02/03 0417) Pulse Rate:  [58-97] 60 (02/03 0417) Resp:  [14-20] 18 (02/03 0417) BP: (96-153)/(49-78) 100/58 (02/03 0417) SpO2:  [96 %-99 %] 98 % (02/03 0417) Weight:  [79.4 kg] 79.4 kg (02/02 1722)  No intake output recorded. Afebrile blood pressure was down to 96/49 last evening. Sats good on room air BMI 26 WBC 8.3 Lactate 8.2 Covid negative   Intake/Output from previous day: No intake/output data recorded. Intake/Output this shift: No intake/output data recorded.  General appearance: alert, cooperative and no distress Resp: clear to auscultation bilaterally GI: soft, still some mild tenderness RLQ on palpation, Chronic constipation, last BM yesterday, No blood reported  Lab Results:  Recent Labs    02/23/20 1757 02/24/20 0411  WBC 9.1 8.3  HGB 12.9* 12.3*  HCT 37.9* 38.0*  PLT 247 224    BMET Recent Labs    02/23/20 1757  NA 138  K 3.4*  CL 100  CO2 25  GLUCOSE 180*  BUN 20  CREATININE 1.08  CALCIUM 9.5   PT/INR No results for input(s): LABPROT, INR in the last 72 hours.  Recent Labs  Lab 02/23/20 1757  AST 20  ALT 22  ALKPHOS 57  BILITOT 0.6  PROT 7.0  ALBUMIN 3.4*     Lipase  No results found for: LIPASE   Medications: . aspirin  81 mg Oral Daily  . atorvastatin  20 mg Oral Daily  . docusate sodium  100 mg Oral BID  . enoxaparin (LOVENOX) injection  40 mg Subcutaneous Q24H  . famotidine  20 mg Oral BID  . hydrochlorothiazide  12.5 mg Oral Daily  . insulin aspart  0-15 Units Subcutaneous TID WC  . latanoprost  1 drop Both Eyes QHS  . levothyroxine  50 mcg Oral q morning - 10a    . cefTRIAXone (ROCEPHIN)  IV Stopped (02/24/20 0231)   And  . metronidazole Stopped (02/24/20 0231)  . lactated ringers 75 mL/hr at 02/24/20 0231   Assessment/Plan Hypertension Diabetes OSA Hyperlipidemia Hx colonic polyps Hypothyroid Chronic constipation   Right lower quadrant abdominal pain Appendicitis VS diverticulitis VS underlying malignancy  FEN: N.p.o./IV fluids ID: Rocephin/Flagyl 2/3 >> day 1 DVT: Lovenox Follow-up: TBD   Plan:  I will give him some clears and continue antibiotics.  He is only had 1 dose of Rocephin and Flagyl. He needs a better bowel regime but I do want to start it now. Will discuss some ongoing IV antibiotics, versus discharge home on oral antibiotics with Dr. Donne Hazel.  LOS: 0 days    Zanayah Shadowens 02/24/2020 Please see Amion

## 2020-02-25 ENCOUNTER — Other Ambulatory Visit: Payer: Medicare Other

## 2020-02-25 LAB — CBC
HCT: 36.1 % — ABNORMAL LOW (ref 39.0–52.0)
Hemoglobin: 11.8 g/dL — ABNORMAL LOW (ref 13.0–17.0)
MCH: 30.6 pg (ref 26.0–34.0)
MCHC: 32.7 g/dL (ref 30.0–36.0)
MCV: 93.8 fL (ref 80.0–100.0)
Platelets: 201 10*3/uL (ref 150–400)
RBC: 3.85 MIL/uL — ABNORMAL LOW (ref 4.22–5.81)
RDW: 12.4 % (ref 11.5–15.5)
WBC: 7 10*3/uL (ref 4.0–10.5)
nRBC: 0 % (ref 0.0–0.2)

## 2020-02-25 LAB — GLUCOSE, CAPILLARY
Glucose-Capillary: 113 mg/dL — ABNORMAL HIGH (ref 70–99)
Glucose-Capillary: 122 mg/dL — ABNORMAL HIGH (ref 70–99)

## 2020-02-25 MED ORDER — ACETAMINOPHEN 325 MG PO TABS: 650.0000 mg | ORAL_TABLET | Freq: Four times a day (QID) | ORAL | Status: AC | PRN

## 2020-02-25 MED ORDER — AMOXICILLIN-POT CLAVULANATE 875-125 MG PO TABS: 1.0000 | ORAL_TABLET | Freq: Two times a day (BID) | ORAL | 0 refills | Status: AC

## 2020-02-25 NOTE — Discharge Summary (Signed)
[facility] Discharge Summary   Patient ID: Kevin [name] MRN: [id number] DOB/AGE: [birth year] 83 y.o.  Admit date: 02/23/2020 Discharge date: 02/25/2020  Discharge Diagnosis Patient Active Problem List   Diagnosis Date Noted  . Abscess of abdominal cavity ([place]) 02/23/2020  . Pre-syncope 11/05/2018  . HLD (hyperlipidemia) 11/05/2018  . Symptomatic bradycardia 11/03/2018  . Hypertension   . Diabetes mellitus without complication [facility])     Consultants None   Imaging: CT ABDOMEN PELVIS W CONTRAST  Result Date: 02/23/2020 CLINICAL DATA:  85 year old male with history of right lower quadrant abdominal pain. Elevated white blood cell count for 1 week. Evaluate for diverticulitis or other etiology. EXAM: CT ABDOMEN AND PELVIS WITH CONTRAST TECHNIQUE: Multidetector CT imaging of the abdomen and pelvis was performed using the standard protocol following bolus administration of intravenous contrast. CONTRAST:  130mL ISOVUE-300 IOPAMIDOL (ISOVUE-300) INJECTION 61% COMPARISON:  No priors. FINDINGS: Lower chest: Scattered areas of scarring in the visualize lung bases. Atherosclerotic calcifications in the descending thoracic aorta as well as the right coronary artery. Small hiatal hernia. Hepatobiliary: No suspicious cystic or solid hepatic lesions. No intra or extrahepatic biliary ductal dilatation. Gallbladder is normal in appearance. Pancreas: No pancreatic mass. No pancreatic ductal dilatation. No pancreatic or peripancreatic fluid collections or inflammatory changes. Spleen: Unremarkable. Adrenals/Urinary Tract: Bilateral kidneys and adrenal glands are normal in appearance. No hydroureteronephrosis. Urinary bladder is normal in appearance. Stomach/Bowel: Normal appearance of the stomach. No pathologic dilatation of small bowel or colon. Vascular/Lymphatic: In the right lower quadrant intimately associated with the medial wall of the cecum in close proximity to the orifice of the appendix  there is a 3.8 x 2.4 x 4.3 cm low-attenuation rim enhancing collection (axial image 51 of series 2 and coronal image 53 of series 3) with surrounding inflammatory changes in the adjacent ileocolic fat. The wall of the cecum adjacent to this is thickened. The appendix traverses adjacent to this lesion, however, the remaining portions of the appendix are otherwise normal in size and appearance. A few scattered colonic diverticulae are noted, most evident in the descending colon and proximal sigmoid colon, without definite surrounding inflammatory changes. Reproductive: Prostate gland and seminal vesicles are unremarkable in appearance. Other: No significant volume of ascites.  No pneumoperitoneum. Musculoskeletal: There are no aggressive appearing lytic or blastic lesions noted in the visualized portions of the skeleton. IMPRESSION: 1. Large low-attenuation rim enhancing structure intimately associated with the medial wall of the cecum with surrounding inflammatory changes. This is closely associated with, but appears separate from the appendix. Overall, this is concerning for intramural abscess in the wall of the cecum, however, the possibility of underlying neoplasm is difficult to entirely exclude. Surgical consultation is strongly recommended in the immediate future. 2. Colonic diverticulosis predominantly in the left side of the colon without definitive findings to suggest an acute diverticulitis at this time. 3. Aortic atherosclerosis. 4. Small hiatal hernia. Critical Value/emergent results were called by telephone at the time of interpretation on 02/23/2020 at 11:34 am to provider [facility], who verbally acknowledged these results. Electronically Signed   By: Vinnie Langton M.D.   On: 02/23/2020 11:34    Procedures None  HPI and Hospital Course:  Kevin [name] is an 85 yo male with a history of HTN, HLD, DM, and GERD who presented with abdominal pain. He has chronic constipation but about 2 weeks  ago had very severe constipation, and has since had intermittent right lower quadrant abdominal pain. A few days ago he  had loss of appetite and fever to 101. He was seen by his PCP and had labs done which per his report showed an elevated WBC. He also had a COVID test which was negative. He was sent for a CT scan which was done this morning, and showed inflammation around the cecum and appendix as well as concern for a small abscess. He was sent to the ED. In the ED he is afebrile and hemodynamically stable. WBC is normal and labs are unremarkable. He still has pain and discomfort in the RLQ.  Patient reports his last colonoscopy was 2 years ago (at [place]) and he had a polyp removed, which he was told was precancerous. He was recommended to have another colonoscopy in 3 years. He has a brother with colon cancer. He has ongoing constipation but denies any blood in his stools.  He was admitted for observation and IV antibiotics. Abscess was not amenable to IR drainage. Diet gradually advanced as tolerated. His pain improved. On 02/25/19 patients vitals were normal, pain improving, tolerating PO, mobilizing, having flatus, and felt stable for discharge home. He will follow up with Dr. Michail Sermon for his previously scheduled appointment. Recommend colonoscopy in 6 weeks for further evaluation of colon/cecum.  Physical Exam: General:  Alert, NAD, pleasant, comfortable Abd:  Soft, ND, mild tenderness in RLQ without guarding, +BS   Allergies as of 02/25/2020      Reactions   Amlodipine Swelling   Metoprolol Succinate [metoprolol] Other (See Comments)   Heart slows down      Medication List    STOP taking these medications   psyllium 58.6 % powder Commonly known as: METAMUCIL     TAKE these medications   acetaminophen 325 MG tablet Commonly known as: TYLENOL Take 2 tablets (650 mg total) by mouth every 6 (six) hours as needed for mild pain. What changed:   medication strength  how much to  take  when to take this  reasons to take this   amLODipine 5 MG tablet Commonly known as: NORVASC Take 1 tablet (5 mg total) by mouth daily.   amoxicillin-clavulanate 875-125 MG tablet Commonly known as: Augmentin Take 1 tablet by mouth 2 (two) times daily for 10 days.   ascorbic acid 500 MG tablet Commonly known as: VITAMIN C Take 500 mg by mouth daily.   aspirin 81 MG tablet Take 81 mg by mouth daily.   atorvastatin 20 MG tablet Commonly known as: LIPITOR Take 20 mg by mouth at bedtime.   docusate sodium 100 MG capsule Commonly known as: COLACE Take 200 mg by mouth in the morning and at bedtime.   famotidine 20 MG tablet Commonly known as: PEPCID Take 20 mg by mouth 2 (two) times daily.   hydrochlorothiazide 12.5 MG capsule Commonly known as: MICROZIDE Take 12.5 mg by mouth daily.   latanoprost 0.005 % ophthalmic solution Commonly known as: XALATAN Place 1 drop into both eyes at bedtime.   levothyroxine 50 MCG tablet Commonly known as: SYNTHROID Take 50 mcg by mouth every morning.   lisinopril 40 MG tablet Commonly known as: ZESTRIL Take 1 tablet (40 mg total) by mouth daily.   metFORMIN 500 MG tablet Commonly known as: GLUCOPHAGE Take 500 mg by mouth daily with breakfast.   multivitamin capsule Take 1 capsule by mouth daily.   vitamin E 200 UNIT capsule Take 200 Units by mouth daily.         Follow-up Information    Wilford Corner, MD Follow up.  Specialty: Gastroenterology Contact information: [id number] [place] [address] [phone]               Signed: Obie Dredge, [facility] 02/25/2020, 9:51 AM

## 2020-02-25 NOTE — Plan of Care (Addendum)
Nutrition Education Note  RD consulted for nutrition education.   RD provided "Low Fiber Nutrition Therapy" handout handout from the Academy of [employer]etics. Reviewed patient's dietary recall and discussed ways for pt to meet nutrition goals over the next several weeks. Explained reasons for pt to follow a low fiber diet over the next 2 weeks. Reviewed low fiber foods and high fiber foods. Recommended a high fiber diet with gradual increase/re-introduction of fiber after following a low fiber diet for 2 weeks.   Teach back method used. Pt verbalizes understanding of information provided.   Expect good compliance.  Body mass index is Body mass index is 26.22 kg/m.Marland Kitchen Pt meets criteria for normal based on current BMI.  Current diet order is soft diet. Pt reports having a good appetite currently and PTA with no difficulties. Labs and medications reviewed. No further nutrition interventions warranted at this time. RD contact information provided. If additional nutrition issues arise, please re-consult RD. Pt to discharge home today.   Corrin Parker, MS, RD, LDN RD pager number/after hours weekend pager number on Amion.

## 2020-02-25 NOTE — Progress Notes (Signed)
Nsg Discharge Note  Patient to be discharged home after RD does teaching.  Admit Date:  02/23/2020 Discharge date: 02/25/2020   [name] to be D/C'd Home per MD order.  AVS completed.  Copy for chart, and copy for patient signed, and dated. Patient/caregiver able to verbalize understanding.  Discharge Medication: Allergies as of 02/25/2020      Reactions   Amlodipine Swelling   Metoprolol Succinate [metoprolol] Other (See Comments)   Heart slows down      Medication List    STOP taking these medications   psyllium 58.6 % powder Commonly known as: METAMUCIL     TAKE these medications   acetaminophen 325 MG tablet Commonly known as: TYLENOL Take 2 tablets (650 mg total) by mouth every 6 (six) hours as needed for mild pain. What changed:   medication strength  how much to take  when to take this  reasons to take this   amLODipine 5 MG tablet Commonly known as: NORVASC Take 1 tablet (5 mg total) by mouth daily.   amoxicillin-clavulanate 875-125 MG tablet Commonly known as: Augmentin Take 1 tablet by mouth 2 (two) times daily for 10 days.   ascorbic acid 500 MG tablet Commonly known as: VITAMIN C Take 500 mg by mouth daily.   aspirin 81 MG tablet Take 81 mg by mouth daily.   atorvastatin 20 MG tablet Commonly known as: LIPITOR Take 20 mg by mouth at bedtime.   docusate sodium 100 MG capsule Commonly known as: COLACE Take 200 mg by mouth in the morning and at bedtime.   famotidine 20 MG tablet Commonly known as: PEPCID Take 20 mg by mouth 2 (two) times daily.   hydrochlorothiazide 12.5 MG capsule Commonly known as: MICROZIDE Take 12.5 mg by mouth daily.   latanoprost 0.005 % ophthalmic solution Commonly known as: XALATAN Place 1 drop into both eyes at bedtime.   levothyroxine 50 MCG tablet Commonly known as: SYNTHROID Take 50 mcg by mouth every morning.   lisinopril 40 MG tablet Commonly known as: ZESTRIL Take 1 tablet (40 mg total) by mouth  daily.   metFORMIN 500 MG tablet Commonly known as: GLUCOPHAGE Take 500 mg by mouth daily with breakfast.   multivitamin capsule Take 1 capsule by mouth daily.   vitamin E 200 UNIT capsule Take 200 Units by mouth daily.       Discharge Assessment: Vitals:   02/25/20 0434 02/25/20 1231  BP: 118/81 123/71  Pulse: 62 62  Resp: 20 16  Temp: 98 F (36.7 C) 98.4 F (36.9 C)  SpO2: 97% 97%   Skin clean, dry and intact without evidence of skin break down, no evidence of skin tears noted. IV catheter discontinued intact. Site without signs and symptoms of complications - no redness or edema noted at insertion site, patient denies c/o pain - only slight tenderness at site.  Dressing with slight pressure applied.  D/c Instructions-Education: Discharge instructions given to patient/family with verbalized understanding. D/c education completed with patient/family including follow up instructions, medication list, d/c activities limitations if indicated, with other d/c instructions as indicated by MD - patient able to verbalize understanding, all questions fully answered. Patient instructed to return to ED, call 911, or call MD for any changes in condition.  Patient escorted via [place], and D/C home via private auto.  Erasmo Leventhal, RN 02/25/2020 1:42 PM

## 2020-02-25 NOTE — Discharge Instructions (Signed)
Low-Fiber Eating Plan (2 weeks) Fiber is found in fruits, vegetables, whole grains, and beans. A low-fiber eating plan may help your digestive system heal if you:  Have certain conditions, such as Crohn's disease, diverticulitis, or irritable bowel syndrome (IBS), and are having a flare-up.  Recently had radiation therapy on your pelvis or bowel.  Recently had intestinal surgery.  Have a new surgical opening in your abdomen (colostomy or ileostomy).  Have an intestine that has narrowed (stricture). Your health care provider will tell you how long to stay on this diet and may recommend that you work with a dietitian. What are tips for following this plan? Reading food labels  Check the nutrition facts label on food products for the amount of dietary fiber.  Choose foods that have less than 2 grams (g) of fiber per serving.   Cooking  Use white flour for baking and cooking.  Cook meat using methods that keep it tender, such as braising or poaching.  Cook eggs until the yolk is completely solid.  Cook with healthy oils, such as olive oil or canola oil. Meal planning  Eat 5-6 small meals throughout the day instead of 3 large meals.  If you are lactose intolerant: ? Choose low-lactose dairy foods. ? Do not eat dairy foods if told by your health care provider or dietitian.  Limit fats and oils to less than 8 teaspoons (39 mL) a day.  Eat small portions of desserts.  Limit acidic, spicy, or fried foods to reduce gas, bloating, and discomfort. General information  Follow instructions from your health care provider or dietitian about how much fiber you should have each day.  Most people on a low-fiber eating plan should eat less than 10 g of fiber a day. Your daily fiber goal is _________________ g.  Take vitamin and mineral supplements as told by your health care provider or dietitian. Chewable or liquid forms are best when on this eating plan. A gummy vitamin is not  recommended. What foods should I eat? Fruits Soft-cooked or canned fruits without skin and seeds. Ripe banana. Applesauce. Fruit juice without pulp. Vegetables Well-cooked or canned vegetables without skin, seeds, or stems. Cooked potatoes without skins. Vegetable juice. Grains All bread and crackers made with white flour. Waffles, pancakes, and [place] toast. Bagels. Pretzels. Melba toast, zwieback, and matzoh. Cooked and dried cereals that do not have whole grains, added fiber, seeds, or dried fruit. Domenick Gong. Hot and cold cereals made with refined corn, rice, or oats. Plain pasta and noodles. White rice. Meats and other proteins Ground meat. Tender cuts of meat or poultry. Eggs. Fish, seafood, and shellfish. Smooth nut butters. Tofu. Dairy All milk products and drinks. Lactose-free milk, including rice, soy, and almond milk. Yogurt without fruit, nuts, chocolate, or granola mixed in. Sour cream. Cottage cheese. Cheese. Fats and oils Olive oil, canola oil, sunflower oil, flaxseed oil, avocado oil, and grapeseed oil. Mayonnaise. Cream cheese. Margarine. Butter. Beverages Decaf coffee. Fruit and vegetable juices. Smoothies (in small amounts, with no pulp or skins, and with fruits from the recommended list). Sports drinks. Herbal tea. Water. Sweets and desserts Plain cakes. Cookies. Cream pies and pies made with recommended fruits. Pudding. Custard. Fruit gelatin. Sherbet. Ice pops. Ice cream without nuts. Hard candy. Honey. Jelly. Molasses. Syrups. Chocolate. Marshmallows. Gumdrops. Seasonings and condiments Ketchup. Mild mustard. Mild salad dressings. Plain gravies. Vinegar. Spices in moderation. Salt. Sugar. Other foods Bouillon. Broth. Cream and strained soups made from recommended foods. Casseroles made with recommended foods.  The items listed above may not be a complete list of foods and beverages you can eat. Contact a dietitian for more information. What foods should I avoid? Fruits Raw  or dried fruit. Berries. Fruit juice with pulp. Prune juice. Vegetables Potato skins. Raw or undercooked vegetables. All beans and bean sprouts. Cooked greens. Corn. Peas. Cabbage. Beets. Broccoli. Brussels sprouts. Cauliflower. Mushrooms. Onions. Peppers. Parsnips. Okra. Sauerkraut. Grains Whole-wheat, whole-grain, or multigrain breads, cereals, or crackers. Rye bread. Cereals with nuts, raisins, or coconut. Bran. Granola. High-fiber cereals. Cornmeal or corn bread. Whole-grain pasta. Wild or brown rice. Quinoa. Popcorn. Buckwheat. Wheat germ. Meats and other proteins Tough, fibrous meats with gristle. Fatty meat. Poultry with skin. Fried meat, Sales executive, or fish. Precooked or cured meat, such as sausages or meat loaves. Berniece Salines. Hot dogs. Nuts and chunky nut butter. Dried peas, beans, and lentils. Hummus. Dairy Yogurt with fruit, nuts, chocolate, or granola mixed in. Full-fat dairy such as whole milk, ice cream, or sour cream. Beverages Caffeinated coffee and teas. Fats and oils Avocado. Coconut. Butter. Sweets and desserts Desserts, cookies, or candies that contain nuts or coconut. Dried fruit. Jams and preserves with seeds. Marmalade. Any dessert made with fruits or grains that are not recommended. Seasonings and condiments Relish. Horseradish. Angie Fava. Olives. Other foods Corn tortilla chips. Soups made with vegetables or grains that are not recommended. The items listed above may not be a complete list of foods and beverages you should avoid. Contact a dietitian for more information. Summary  Most people on a low-fiber eating plan should eat less than 10 grams of fiber a day. Follow recommendations from your health care provider or dietitian about how much fiber you should have each day.  Always check nutrition facts labels to see the dietary fiber amount in packaged foods. A low-fiber food will have less than 2 grams of fiber per serving.  Try to avoid whole grains, raw fruits and  vegetables, dried fruit, tough cuts of meat, nuts, and seeds.  Take a vitamin and mineral supplement as told by your health care provider or dietitian. This information is not intended to replace advice given to you by your health care provider. Make sure you discuss any questions you have with your health care provider. Document Revised: 05/13/2019 Document Reviewed: 05/13/2019 Elsevier Patient Education  2021 [employer].

## 2020-02-28 DIAGNOSIS — E782 Mixed hyperlipidemia: Secondary | ICD-10-CM | POA: Diagnosis not present

## 2020-02-28 DIAGNOSIS — E119 Type 2 diabetes mellitus without complications: Secondary | ICD-10-CM | POA: Diagnosis not present

## 2020-02-28 DIAGNOSIS — E1151 Type 2 diabetes mellitus with diabetic peripheral angiopathy without gangrene: Secondary | ICD-10-CM | POA: Diagnosis not present

## 2020-02-28 DIAGNOSIS — E1169 Type 2 diabetes mellitus with other specified complication: Secondary | ICD-10-CM | POA: Diagnosis not present

## 2020-02-28 DIAGNOSIS — G4733 Obstructive sleep apnea (adult) (pediatric): Secondary | ICD-10-CM | POA: Diagnosis not present

## 2020-02-28 DIAGNOSIS — I10 Essential (primary) hypertension: Secondary | ICD-10-CM | POA: Diagnosis not present

## 2020-02-28 DIAGNOSIS — H4089 Other specified glaucoma: Secondary | ICD-10-CM | POA: Diagnosis not present

## 2020-02-28 DIAGNOSIS — E039 Hypothyroidism, unspecified: Secondary | ICD-10-CM | POA: Diagnosis not present

## 2020-02-29 LAB — CULTURE, BLOOD (ROUTINE X 2)
Culture: NO GROWTH
Culture: NO GROWTH
Special Requests: ADEQUATE
Special Requests: ADEQUATE

## 2020-03-07 DIAGNOSIS — K5900 Constipation, unspecified: Secondary | ICD-10-CM | POA: Diagnosis not present

## 2020-03-07 DIAGNOSIS — K5720 Diverticulitis of large intestine with perforation and abscess without bleeding: Secondary | ICD-10-CM | POA: Diagnosis not present

## 2020-03-07 DIAGNOSIS — R1031 Right lower quadrant pain: Secondary | ICD-10-CM | POA: Diagnosis not present

## 2020-03-27 DIAGNOSIS — G4733 Obstructive sleep apnea (adult) (pediatric): Secondary | ICD-10-CM | POA: Diagnosis not present

## 2020-04-14 DIAGNOSIS — Z01812 Encounter for preprocedural laboratory examination: Secondary | ICD-10-CM | POA: Diagnosis not present

## 2020-04-19 DIAGNOSIS — R1031 Right lower quadrant pain: Secondary | ICD-10-CM | POA: Diagnosis not present

## 2020-04-19 DIAGNOSIS — R933 Abnormal findings on diagnostic imaging of other parts of digestive tract: Secondary | ICD-10-CM | POA: Diagnosis not present

## 2020-04-19 DIAGNOSIS — D120 Benign neoplasm of cecum: Secondary | ICD-10-CM | POA: Diagnosis not present

## 2020-04-19 DIAGNOSIS — K640 First degree hemorrhoids: Secondary | ICD-10-CM | POA: Diagnosis not present

## 2020-04-19 DIAGNOSIS — K5730 Diverticulosis of large intestine without perforation or abscess without bleeding: Secondary | ICD-10-CM | POA: Diagnosis not present

## 2020-04-20 DIAGNOSIS — H4089 Other specified glaucoma: Secondary | ICD-10-CM | POA: Diagnosis not present

## 2020-04-20 DIAGNOSIS — E039 Hypothyroidism, unspecified: Secondary | ICD-10-CM | POA: Diagnosis not present

## 2020-04-20 DIAGNOSIS — E1169 Type 2 diabetes mellitus with other specified complication: Secondary | ICD-10-CM | POA: Diagnosis not present

## 2020-04-20 DIAGNOSIS — I10 Essential (primary) hypertension: Secondary | ICD-10-CM | POA: Diagnosis not present

## 2020-04-20 DIAGNOSIS — E1151 Type 2 diabetes mellitus with diabetic peripheral angiopathy without gangrene: Secondary | ICD-10-CM | POA: Diagnosis not present

## 2020-04-20 DIAGNOSIS — E782 Mixed hyperlipidemia: Secondary | ICD-10-CM | POA: Diagnosis not present

## 2020-05-12 ENCOUNTER — Other Ambulatory Visit: Payer: Self-pay | Admitting: Gastroenterology

## 2020-07-25 ENCOUNTER — Encounter (HOSPITAL_COMMUNITY): Payer: Self-pay | Admitting: Gastroenterology

## 2020-07-26 ENCOUNTER — Other Ambulatory Visit: Payer: Self-pay | Admitting: Gastroenterology

## 2020-07-26 ENCOUNTER — Other Ambulatory Visit: Payer: Self-pay

## 2020-08-01 ENCOUNTER — Ambulatory Visit (HOSPITAL_COMMUNITY): Payer: Medicare Other | Admitting: Anesthesiology

## 2020-08-01 ENCOUNTER — Ambulatory Visit (HOSPITAL_COMMUNITY)
Admission: RE | Admit: 2020-08-01 | Discharge: 2020-08-01 | Disposition: A | Discharge status: Home / Self Care | Payer: Medicare Other | Source: Ambulatory Visit | Attending: Gastroenterology | Admitting: Gastroenterology

## 2020-08-01 ENCOUNTER — Encounter (HOSPITAL_COMMUNITY): Payer: Self-pay | Admitting: Gastroenterology

## 2020-08-01 ENCOUNTER — Other Ambulatory Visit: Payer: Self-pay

## 2020-08-01 ENCOUNTER — Encounter (HOSPITAL_COMMUNITY)
Admission: RE | Disposition: A | Discharge status: Home / Self Care | Payer: Self-pay | Source: Ambulatory Visit | Attending: Gastroenterology

## 2020-08-01 DIAGNOSIS — Z1211 Encounter for screening for malignant neoplasm of colon: Secondary | ICD-10-CM | POA: Diagnosis not present

## 2020-08-01 DIAGNOSIS — Z87891 Personal history of nicotine dependence: Secondary | ICD-10-CM | POA: Diagnosis not present

## 2020-08-01 DIAGNOSIS — Z7984 Long term (current) use of oral hypoglycemic drugs: Secondary | ICD-10-CM | POA: Insufficient documentation

## 2020-08-01 DIAGNOSIS — Z888 Allergy status to other drugs, medicaments and biological substances status: Secondary | ICD-10-CM | POA: Insufficient documentation

## 2020-08-01 DIAGNOSIS — K635 Polyp of colon: Secondary | ICD-10-CM | POA: Diagnosis not present

## 2020-08-01 DIAGNOSIS — Z79899 Other long term (current) drug therapy: Secondary | ICD-10-CM | POA: Diagnosis not present

## 2020-08-01 DIAGNOSIS — E1122 Type 2 diabetes mellitus with diabetic chronic kidney disease: Secondary | ICD-10-CM | POA: Diagnosis not present

## 2020-08-01 DIAGNOSIS — Z7982 Long term (current) use of aspirin: Secondary | ICD-10-CM | POA: Insufficient documentation

## 2020-08-01 DIAGNOSIS — N189 Chronic kidney disease, unspecified: Secondary | ICD-10-CM | POA: Diagnosis not present

## 2020-08-01 DIAGNOSIS — Z800 Family history of malignant neoplasm of digestive organs: Secondary | ICD-10-CM | POA: Insufficient documentation

## 2020-08-01 DIAGNOSIS — K640 First degree hemorrhoids: Secondary | ICD-10-CM | POA: Insufficient documentation

## 2020-08-01 DIAGNOSIS — K5790 Diverticulosis of intestine, part unspecified, without perforation or abscess without bleeding: Secondary | ICD-10-CM | POA: Diagnosis not present

## 2020-08-01 DIAGNOSIS — D120 Benign neoplasm of cecum: Secondary | ICD-10-CM | POA: Insufficient documentation

## 2020-08-01 DIAGNOSIS — Z79890 Hormone replacement therapy (postmenopausal): Secondary | ICD-10-CM | POA: Insufficient documentation

## 2020-08-01 DIAGNOSIS — Z860101 Personal history of adenomatous and serrated colon polyps: Secondary | ICD-10-CM

## 2020-08-01 DIAGNOSIS — K5730 Diverticulosis of large intestine without perforation or abscess without bleeding: Secondary | ICD-10-CM | POA: Insufficient documentation

## 2020-08-01 DIAGNOSIS — Z86010 Personal history of colonic polyps: Secondary | ICD-10-CM | POA: Insufficient documentation

## 2020-08-01 DIAGNOSIS — I129 Hypertensive chronic kidney disease with stage 1 through stage 4 chronic kidney disease, or unspecified chronic kidney disease: Secondary | ICD-10-CM | POA: Diagnosis not present

## 2020-08-01 DIAGNOSIS — K648 Other hemorrhoids: Secondary | ICD-10-CM | POA: Diagnosis not present

## 2020-08-01 HISTORY — DX: Chronic kidney disease, unspecified: N18.9

## 2020-08-01 HISTORY — DX: Obstructive sleep apnea (adult) (pediatric): G47.33

## 2020-08-01 HISTORY — PX: HOT HEMOSTASIS: SHX5433

## 2020-08-01 HISTORY — PX: COLONOSCOPY WITH PROPOFOL: SHX5780

## 2020-08-01 HISTORY — PX: POLYPECTOMY: SHX5525

## 2020-08-01 LAB — GLUCOSE, CAPILLARY: Glucose-Capillary: 126 mg/dL — ABNORMAL HIGH (ref 70–99)

## 2020-08-01 SURGERY — COLONOSCOPY WITH PROPOFOL
Anesthesia: Monitor Anesthesia Care

## 2020-08-01 MED ORDER — LACTATED RINGERS IV SOLN: INTRAVENOUS | Status: DC

## 2020-08-01 MED ORDER — LIDOCAINE 2% (20 MG/ML) 5 ML SYRINGE
INTRAMUSCULAR | Status: DC | PRN
  Administered 2020-08-01: 40 mg via INTRAVENOUS

## 2020-08-01 MED ORDER — PROPOFOL 10 MG/ML IV BOLUS
INTRAVENOUS | Status: DC | PRN
  Administered 2020-08-01 (×5): 20 mg via INTRAVENOUS
  Administered 2020-08-01: 50 mg via INTRAVENOUS
  Administered 2020-08-01: 20 mg via INTRAVENOUS
  Administered 2020-08-01: 10 mg via INTRAVENOUS

## 2020-08-01 SURGICAL SUPPLY — 22 items

## 2020-08-01 NOTE — Op Note (Signed)
[facility] Patient Name: Kevin [name] Procedure Date: 08/01/2020 MRN: [id number] Attending MD: Lear Ng , MD Date of Birth: 1935/06/13 CSN: [id number] Age: 85 Admit Type: Outpatient Procedure:                Colonoscopy Indications:              High risk colon cancer surveillance: Personal                            history of adenoma (10 mm or greater in size), Last                            colonoscopy within the past few months Providers:                Lear Ng, MD, Burtis Junes, RN, Hinton Dyer,                            Ladona Ridgel, Technician, Glenis Smoker, CRNA Referring MD:             Lear Ng, MD Medicines:                Propofol per Anesthesia, Monitored Anesthesia Care Complications:            No immediate complications. Estimated Blood Loss:     Estimated blood loss was minimal. Procedure:                Pre-Anesthesia Assessment:                           - Prior to the procedure, a History and Physical                            was performed, and patient medications and                            allergies were reviewed. The patient's tolerance of                            previous anesthesia was also reviewed. The risks                            and benefits of the procedure and the sedation                            options and risks were discussed with the patient.                            All questions were answered, and informed consent                            was obtained. Prior Anticoagulants: The patient has                            taken no previous anticoagulant or antiplatelet  agents. ASA Grade Assessment: III - A patient with                            severe systemic disease. After reviewing the risks                            and benefits, the patient was deemed in                            satisfactory condition to undergo the procedure.                            After obtaining informed consent, the colonoscope                            was passed under direct vision. Throughout the                            procedure, the patient's blood pressure, pulse, and                            oxygen saturations were monitored continuously. The                            PCF-H190DL ([id number] Olympus pediatric colonscope                            was introduced through the anus and advanced to the                            the cecum, identified by appendiceal orifice and                            ileocecal valve. The colonoscopy was performed with                            difficulty due to significant looping and fair                            prep. Successful completion of the procedure was                            aided by straightening and shortening the scope to                            obtain bowel loop reduction, applying abdominal                            pressure and lavage. The patient tolerated the                            procedure well. The quality of the bowel  preparation was fair and fair but repeated                            irrigation led to a good and adequate prep. The                            terminal ileum, ileocecal valve, appendiceal                            orifice, and rectum were photographed. Scope In: 10:14:33 AM Scope Out: 10:34:31 AM Scope Withdrawal Time: 0 hours 14 minutes 54 seconds  Total Procedure Duration: 0 hours 19 minutes 58 seconds  Findings:      The perianal and digital rectal examinations were normal.      A 2 mm polyp was found in the cecum. The polyp was semi-sessile. The       polyp was removed with a cold biopsy forceps. Resection and retrieval       were complete. Estimated blood loss was minimal.      A 5 mm polyp was found in the cecum. The polyp was flat. Fulguration to       ablate the lesion by argon plasma was successful. Estimated blood loss:        none.      Multiple small and large-mouthed diverticula were found in the sigmoid       colon.      Internal hemorrhoids were found during retroflexion. The hemorrhoids       were large and Grade I (internal hemorrhoids that do not prolapse).      The terminal ileum appeared normal. Impression:               - Preparation of the colon was fair.                           - One 2 mm polyp in the cecum, removed with a cold                            biopsy forceps. Resected and retrieved.                           - One 5 mm polyp in the cecum. Treated with argon                            plasma coagulation (APC).                           - Diverticulosis in the sigmoid colon.                           - Internal hemorrhoids.                           - The examined portion of the ileum was normal. Moderate Sedation:      N/A - MAC procedure Recommendation:           - Patient has a contact number available for  emergencies. The signs and symptoms of potential                            delayed complications were discussed with the                            patient. Return to normal activities tomorrow.                            Written discharge instructions were provided to the                            patient.                           - High fiber diet.                           - Await pathology results.                           - Repeat colonoscopy for surveillance based on                            pathology results. Procedure Code(s):        --- Professional ---                           [phone], Colonoscopy, flexible; with ablation of                            tumor(s), polyp(s), or other lesion(s) (includes                            pre- and post-dilation and guide wire passage, when                            performed)                           45380, 24, Colonoscopy, flexible; with biopsy,                            single or  multiple Diagnosis Code(s):        --- Professional ---                           Z86.010, Personal history of colonic polyps                           K63.5, Polyp of colon                           K64.0, First degree hemorrhoids                           K57.30, Diverticulosis of large intestine without  perforation or abscess without bleeding CPT copyright 2019 [employer]. All rights reserved. The codes documented in this report are preliminary and upon coder review may  be revised to meet current compliance requirements. Lear Ng, MD 08/01/2020 10:48:50 AM This report has been signed electronically. Number of Addenda: 0

## 2020-08-01 NOTE — Anesthesia Preprocedure Evaluation (Addendum)
Anesthesia Evaluation  Patient identified by MRN, date of birth, ID band Patient awake    Reviewed: Allergy & Precautions, NPO status , Patient's Chart, lab work & pertinent test results  Airway Mallampati: I  TM Distance: >3 FB Neck ROM: Full    Dental  (+) Edentulous Upper, Edentulous Lower   Pulmonary sleep apnea and Continuous Positive Airway Pressure Ventilation , former smoker,    Pulmonary exam normal breath sounds clear to auscultation       Cardiovascular hypertension, Pt. on medications + Peripheral Vascular Disease  Normal cardiovascular exam Rhythm:Regular Rate:Normal  TTE 2020 1. Left ventricular ejection fraction, by visual estimation, is 55 to  60%. The left ventricle has normal function. Left ventricular septal wall  thickness was normal. Normal left ventricular posterior wall thickness.  There is no left ventricular  hypertrophy.  2. Left ventricular diastolic Doppler parameters are consistent with  pseudonormalization pattern of LV diastolic filling.  3. Global right ventricle has normal systolic function.The right  ventricular size is normal. No increase in right ventricular wall  thickness.  4. Left atrial size was normal.  5. Right atrial size was normal.  6. The mitral valve is normal in structure. Trace mitral valve  regurgitation.  7. The tricuspid valve is normal in structure. Tricuspid valve  regurgitation is mild.  8. The aortic valve is tricuspid Aortic valve regurgitation was not  visualized by color flow Doppler.  9. The pulmonic valve was grossly normal. Pulmonic valve regurgitation is  trivial by color flow Doppler.  10. Normal pulmonary artery systolic pressure.  11. The inferior vena cava is normal in size with greater than 50%  respiratory variability, suggesting right atrial pressure of 3 mmHg.   Neuro/Psych negative neurological ROS  negative psych ROS   GI/Hepatic Neg  liver ROS, GERD  Controlled and Medicated,  Endo/Other  negative endocrine ROSdiabetes, Type 2, Oral Hypoglycemic Agents  Renal/GU negative Renal ROS  negative genitourinary   Musculoskeletal negative musculoskeletal ROS (+)   Abdominal   Peds  Hematology negative hematology ROS (+)   Anesthesia Other Findings Colonoscopy for polyps  Reproductive/Obstetrics                            Anesthesia Physical Anesthesia Plan  ASA: 3  Anesthesia Plan: MAC   Post-op Pain Management:    Induction: Intravenous  PONV Risk Score and Plan: Propofol infusion and Treatment may vary due to age or medical condition  Airway Management Planned: Natural Airway  Additional Equipment:   Intra-op Plan:   Post-operative Plan:   Informed Consent: I have reviewed the patients History and Physical, chart, labs and discussed the procedure including the risks, benefits and alternatives for the proposed anesthesia with the patient or authorized representative who has indicated his/her understanding and acceptance.     Dental advisory given  Plan Discussed with: CRNA  Anesthesia Plan Comments:         Anesthesia Quick Evaluation

## 2020-08-01 NOTE — Anesthesia Postprocedure Evaluation (Signed)
Anesthesia Post Note  Patient: FATE CASTER  Procedure(s) Performed: COLONOSCOPY WITH PROPOFOL HOT HEMOSTASIS (ARGON PLASMA COAGULATION/BICAP) POLYPECTOMY     Patient location during evaluation: Endoscopy Anesthesia Type: MAC Level of consciousness: awake and alert Pain management: pain level controlled Vital Signs Assessment: post-procedure vital signs reviewed and stable Respiratory status: spontaneous breathing, nonlabored ventilation, respiratory function stable and patient connected to nasal cannula oxygen Cardiovascular status: blood pressure returned to baseline and stable Postop Assessment: no apparent nausea or vomiting Anesthetic complications: no   No notable events documented.  Last Vitals:  Vitals:   08/01/20 1050 08/01/20 1100  BP: 103/62 (!) 125/49  Pulse: 66 (!) 52  Resp: 20 14  Temp:    SpO2: 100% 97%    Last Pain:  Vitals:   08/01/20 1100  TempSrc:   PainSc: 0-No pain                 Kamylah Manzo L Upton Russey

## 2020-08-01 NOTE — Discharge Instructions (Signed)

## 2020-08-01 NOTE — Transfer of Care (Signed)
Immediate Anesthesia Transfer of Care Note  Patient: Kevin [name]  Procedure(s) Performed: COLONOSCOPY WITH PROPOFOL HOT HEMOSTASIS (ARGON PLASMA COAGULATION/BICAP) POLYPECTOMY  Patient Location: PACU and Endoscopy Unit  Anesthesia Type:MAC  Level of Consciousness: awake and alert   Airway & Oxygen Therapy: Patient Spontanous Breathing and Patient connected to face mask oxygen  Post-op Assessment: Report given to RN and Post -op Vital signs reviewed and stable  Post vital signs: Reviewed and stable  Last Vitals:  Vitals Value Taken Time  BP    Temp    Pulse    Resp    SpO2      Last Pain:  Vitals:   08/01/20 0941  TempSrc: Oral  PainSc: 0-No pain         Complications: No notable events documented.

## 2020-08-01 NOTE — Anesthesia Procedure Notes (Signed)
Procedure Name: MAC Date/Time: 08/01/2020 10:06 AM Performed by: Cynda Familia, CRNA Pre-anesthesia Checklist: Patient identified, Emergency Drugs available, Suction available, Patient being monitored and Timeout performed Patient Re-evaluated:Patient Re-evaluated prior to induction Oxygen Delivery Method: Simple face mask Placement Confirmation: positive ETCO2 and breath sounds checked- equal and bilateral Dental Injury: Teeth and Oropharynx as per pre-operative assessment

## 2020-08-01 NOTE — H&P (Addendum)
  Date of Initial H&P: 07/26/20  History reviewed, patient examined, no change in status, stable for surgery.

## 2020-08-02 LAB — SURGICAL PATHOLOGY

## 2020-08-11 ENCOUNTER — Other Ambulatory Visit: Payer: Self-pay

## 2020-08-11 ENCOUNTER — Encounter: Payer: Self-pay | Admitting: Cardiology

## 2020-08-11 ENCOUNTER — Ambulatory Visit: Payer: Medicare Other | Admitting: Cardiology

## 2020-08-11 VITALS — BP 146/68 | HR 59 | Ht 68.5 in | Wt 175.0 lb

## 2020-08-11 DIAGNOSIS — I10 Essential (primary) hypertension: Secondary | ICD-10-CM | POA: Diagnosis not present

## 2020-08-11 DIAGNOSIS — G4733 Obstructive sleep apnea (adult) (pediatric): Secondary | ICD-10-CM

## 2020-08-11 NOTE — Patient Instructions (Signed)

## 2020-08-11 NOTE — Progress Notes (Signed)
Date:  08/11/2020   ID:  Kevin [name], DOB 02-01-1935, MRN [id number]   PCP:  Kathyrn Lass, MD  Cardiologist:  Fransico Him, MD  Electrophysiologist:  None   Chief Complaint:  OSA, HTN, sinus bradycardia  History of Present Illness:    Kevin [name] is a 85 y.o. male with thrombocytopenia, DM, HTN, GERD, PAD and chronic venous insufficiency (managed conservatively by Dr. Donzetta Matters), sinus bradycardia, trifascicular block with chronic RBBB, former tobacco use. He also has mild OSA with  an AHI of 9.6/hr and O2 sats as low as 81%. When I saw him last we decided to treat his OSA with CPAP and he is now back for followup after getting on PAP therapy.    He is doing well with his CPAP device and thinks that he has gotten used to it.  He tolerates the mask and feels the pressure is adequate.  Since going on CPAP he feels rested in the am and has no significant daytime sleepiness.  He denies any significant mouth or nasal dryness or nasal congestion.  he does not think that he snores.     Prior CV studies:   The following studies were reviewed today:  PAP compliance download  Past Medical History:  Diagnosis Date   Blood dyscrasia    chronic thrombocytopenia   Chronic kidney disease    Chronic venous insufficiency    Diabetes mellitus type 2 in nonobese (HCC)    GERD (gastroesophageal reflux disease)    HLD (hyperlipidemia) 11/05/2018   Hypertension    OSA on CPAP    PAD (peripheral artery disease) ([place])    Pre-syncope 11/05/2018   Symptomatic bradycardia 11/03/2018   Past Surgical History:  Procedure Laterality Date   CARPAL TUNNEL RELEASE Left 04/28/2015   Procedure: LEFT WRIST CARPAL TUNNEL RELEASE;  Surgeon: Melrose Nakayama, MD;  Location: [place];  Service: Orthopedics;  Laterality: Left;   COLONOSCOPY WITH PROPOFOL N/A 08/01/2020   Procedure: COLONOSCOPY WITH PROPOFOL;  Surgeon: Wilford Corner, MD;  Location: [facility];  Service: Endoscopy;  Laterality:  N/A;   HOT HEMOSTASIS N/A 08/01/2020   Procedure: HOT HEMOSTASIS (ARGON PLASMA COAGULATION/BICAP);  Surgeon: Wilford Corner, MD;  Location: [facility];  Service: Endoscopy;  Laterality: N/A;   POLYPECTOMY  08/01/2020   Procedure: POLYPECTOMY;  Surgeon: Wilford Corner, MD;  Location: [facility];  Service: Endoscopy;;   SEPTOPLASTY       No outpatient medications have been marked as taking for the 08/11/20 encounter (Appointment) with Sueanne Margarita, MD.     Allergies:   Amlodipine and Metoprolol succinate [metoprolol]   Social History   Tobacco Use   Smoking status: Former    Types: Cigarettes   Smokeless tobacco: Former  Substance Use Topics   Alcohol use: No   Drug use: No     Family Hx: The patient's family history includes Heart attack (age of onset: 31) in his mother; Stroke (age of onset: 8) in his father.  ROS:   Please see the history of present illness.     All other systems reviewed and are negative.   Labs/Other Tests and Data Reviewed:    Recent Labs: 02/23/2020: ALT 22; BUN 20; Creatinine, Ser 1.08; Potassium 3.4; Sodium 138 02/25/2020: Hemoglobin 11.8; Platelets 201   Recent Lipid Panel No results found for: CHOL, TRIG, HDL, CHOLHDL, LDLCALC, LDLDIRECT  Wt Readings from Last 3 Encounters:  08/01/20 173 lb 15.1 oz (78.9 kg)  02/23/20 175 lb (79.4 kg)  08/13/19 174 lb (78.9 kg)     Objective:    Vital Signs:  There were no vitals taken for this visit.   GEN: Well nourished, well developed in no acute distress HEENT: Normal NECK: No JVD; No carotid bruits LYMPHATICS: No lymphadenopathy CARDIAC:RRR, no murmurs, rubs, gallops RESPIRATORY:  Clear to auscultation without rales, wheezing or rhonchi  ABDOMEN: Soft, non-tender, non-distended MUSCULOSKELETAL:  No edema; No deformity  SKIN: Warm and dry NEUROLOGIC:  Alert and oriented x 3 PSYCHIATRIC:  Normal affect   EKG was performed in the office today and showed sinus bradycardia with  RBBB  ASSESSMENT & PLAN:    1.  OSA - The patient is tolerating PAP therapy well without any problems. The PAP download performed by his DME was personally reviewed and interpreted by me today and showed an AHI of 0.5/hr on auto PAP cm H2O with 97% compliance in using more than 4 hours nightly.  The patient has been using and benefiting from PAP use and will continue to benefit from therapy.    2.  HTN -BP is borderline controlled on exam today -Continue prescription drug management with Lisinopril '40mg'$  daily with PRN refills   Medication Adjustments/Labs and Tests Ordered: Current medicines are reviewed at length with the patient today.  Concerns regarding medicines are outlined above.  Tests Ordered: No orders of the defined types were placed in this encounter.  Medication Changes: No orders of the defined types were placed in this encounter.   Disposition:  Follow up 1 year  Signed, Fransico Him, MD  08/11/2020 8:45 AM    [place]

## 2020-08-13 DIAGNOSIS — Z20822 Contact with and (suspected) exposure to covid-19: Secondary | ICD-10-CM | POA: Diagnosis not present

## 2020-08-13 DIAGNOSIS — R509 Fever, unspecified: Secondary | ICD-10-CM | POA: Diagnosis not present

## 2020-08-22 DIAGNOSIS — M65332 Trigger finger, left middle finger: Secondary | ICD-10-CM | POA: Diagnosis not present

## 2020-09-19 DIAGNOSIS — E039 Hypothyroidism, unspecified: Secondary | ICD-10-CM | POA: Diagnosis not present

## 2020-09-19 DIAGNOSIS — I10 Essential (primary) hypertension: Secondary | ICD-10-CM | POA: Diagnosis not present

## 2020-09-19 DIAGNOSIS — E1169 Type 2 diabetes mellitus with other specified complication: Secondary | ICD-10-CM | POA: Diagnosis not present

## 2020-09-19 DIAGNOSIS — E1151 Type 2 diabetes mellitus with diabetic peripheral angiopathy without gangrene: Secondary | ICD-10-CM | POA: Diagnosis not present

## 2020-09-19 DIAGNOSIS — E782 Mixed hyperlipidemia: Secondary | ICD-10-CM | POA: Diagnosis not present

## 2020-09-19 DIAGNOSIS — H4089 Other specified glaucoma: Secondary | ICD-10-CM | POA: Diagnosis not present

## 2020-09-27 DIAGNOSIS — L570 Actinic keratosis: Secondary | ICD-10-CM | POA: Diagnosis not present

## 2020-09-27 DIAGNOSIS — M79604 Pain in right leg: Secondary | ICD-10-CM | POA: Diagnosis not present

## 2020-09-27 DIAGNOSIS — M79605 Pain in left leg: Secondary | ICD-10-CM | POA: Diagnosis not present

## 2020-10-12 DIAGNOSIS — Z23 Encounter for immunization: Secondary | ICD-10-CM | POA: Diagnosis not present

## 2020-10-13 DIAGNOSIS — E039 Hypothyroidism, unspecified: Secondary | ICD-10-CM | POA: Diagnosis not present

## 2020-10-16 DIAGNOSIS — H4089 Other specified glaucoma: Secondary | ICD-10-CM | POA: Diagnosis not present

## 2020-10-16 DIAGNOSIS — E782 Mixed hyperlipidemia: Secondary | ICD-10-CM | POA: Diagnosis not present

## 2020-10-16 DIAGNOSIS — I10 Essential (primary) hypertension: Secondary | ICD-10-CM | POA: Diagnosis not present

## 2020-10-16 DIAGNOSIS — E1151 Type 2 diabetes mellitus with diabetic peripheral angiopathy without gangrene: Secondary | ICD-10-CM | POA: Diagnosis not present

## 2020-10-16 DIAGNOSIS — E039 Hypothyroidism, unspecified: Secondary | ICD-10-CM | POA: Diagnosis not present

## 2020-10-16 DIAGNOSIS — E1169 Type 2 diabetes mellitus with other specified complication: Secondary | ICD-10-CM | POA: Diagnosis not present

## 2020-10-23 DIAGNOSIS — E039 Hypothyroidism, unspecified: Secondary | ICD-10-CM | POA: Diagnosis not present

## 2020-10-23 DIAGNOSIS — I10 Essential (primary) hypertension: Secondary | ICD-10-CM | POA: Diagnosis not present

## 2020-10-23 DIAGNOSIS — E782 Mixed hyperlipidemia: Secondary | ICD-10-CM | POA: Diagnosis not present

## 2020-10-23 DIAGNOSIS — H4089 Other specified glaucoma: Secondary | ICD-10-CM | POA: Diagnosis not present

## 2020-10-23 DIAGNOSIS — E1169 Type 2 diabetes mellitus with other specified complication: Secondary | ICD-10-CM | POA: Diagnosis not present

## 2020-10-23 DIAGNOSIS — E1151 Type 2 diabetes mellitus with diabetic peripheral angiopathy without gangrene: Secondary | ICD-10-CM | POA: Diagnosis not present

## 2020-10-31 DIAGNOSIS — M1712 Unilateral primary osteoarthritis, left knee: Secondary | ICD-10-CM | POA: Diagnosis not present

## 2020-10-31 DIAGNOSIS — M1711 Unilateral primary osteoarthritis, right knee: Secondary | ICD-10-CM | POA: Diagnosis not present

## 2021-01-16 DIAGNOSIS — E039 Hypothyroidism, unspecified: Secondary | ICD-10-CM | POA: Diagnosis not present

## 2021-01-16 DIAGNOSIS — E1151 Type 2 diabetes mellitus with diabetic peripheral angiopathy without gangrene: Secondary | ICD-10-CM | POA: Diagnosis not present

## 2021-01-16 DIAGNOSIS — E1169 Type 2 diabetes mellitus with other specified complication: Secondary | ICD-10-CM | POA: Diagnosis not present

## 2021-01-16 DIAGNOSIS — H4089 Other specified glaucoma: Secondary | ICD-10-CM | POA: Diagnosis not present

## 2021-01-16 DIAGNOSIS — E782 Mixed hyperlipidemia: Secondary | ICD-10-CM | POA: Diagnosis not present

## 2021-01-16 DIAGNOSIS — I10 Essential (primary) hypertension: Secondary | ICD-10-CM | POA: Diagnosis not present

## 2021-01-17 DIAGNOSIS — H6092 Unspecified otitis externa, left ear: Secondary | ICD-10-CM | POA: Diagnosis not present

## 2021-01-30 DIAGNOSIS — M65332 Trigger finger, left middle finger: Secondary | ICD-10-CM | POA: Diagnosis not present

## 2021-02-06 ENCOUNTER — Other Ambulatory Visit: Payer: Self-pay

## 2021-02-06 ENCOUNTER — Encounter (HOSPITAL_COMMUNITY): Payer: Self-pay | Admitting: *Deleted

## 2021-02-06 ENCOUNTER — Ambulatory Visit (HOSPITAL_COMMUNITY)
Admission: EM | Admit: 2021-02-06 | Discharge: 2021-02-06 | Disposition: A | Discharge status: Home / Self Care | Payer: Medicare Other | Attending: Urgent Care | Admitting: Urgent Care

## 2021-02-06 DIAGNOSIS — I10 Essential (primary) hypertension: Secondary | ICD-10-CM | POA: Diagnosis not present

## 2021-02-06 DIAGNOSIS — E1159 Type 2 diabetes mellitus with other circulatory complications: Secondary | ICD-10-CM | POA: Diagnosis not present

## 2021-02-06 DIAGNOSIS — J029 Acute pharyngitis, unspecified: Secondary | ICD-10-CM

## 2021-02-06 DIAGNOSIS — N189 Chronic kidney disease, unspecified: Secondary | ICD-10-CM | POA: Diagnosis not present

## 2021-02-06 DIAGNOSIS — E1122 Type 2 diabetes mellitus with diabetic chronic kidney disease: Secondary | ICD-10-CM | POA: Insufficient documentation

## 2021-02-06 DIAGNOSIS — B349 Viral infection, unspecified: Secondary | ICD-10-CM | POA: Diagnosis not present

## 2021-02-06 DIAGNOSIS — I129 Hypertensive chronic kidney disease with stage 1 through stage 4 chronic kidney disease, or unspecified chronic kidney disease: Secondary | ICD-10-CM | POA: Diagnosis not present

## 2021-02-06 DIAGNOSIS — E785 Hyperlipidemia, unspecified: Secondary | ICD-10-CM | POA: Insufficient documentation

## 2021-02-06 DIAGNOSIS — R052 Subacute cough: Secondary | ICD-10-CM

## 2021-02-06 DIAGNOSIS — U071 COVID-19: Secondary | ICD-10-CM | POA: Insufficient documentation

## 2021-02-06 DIAGNOSIS — R051 Acute cough: Secondary | ICD-10-CM | POA: Diagnosis present

## 2021-02-06 DIAGNOSIS — E119 Type 2 diabetes mellitus without complications: Secondary | ICD-10-CM

## 2021-02-06 LAB — BASIC METABOLIC PANEL
Anion gap: 7 (ref 5–15)
BUN: 17 mg/dL (ref 8–23)
CO2: 31 mmol/L (ref 22–32)
Calcium: 9.1 mg/dL (ref 8.9–10.3)
Chloride: 102 mmol/L (ref 98–111)
Creatinine, Ser: 1.06 mg/dL (ref 0.61–1.24)
GFR, Estimated: >60 mL/min (ref >60)
Glucose, Bld: 182 mg/dL — ABNORMAL HIGH (ref 70–99)
Potassium: 3.7 mmol/L (ref 3.5–5.1)
Sodium: 140 mmol/L (ref 135–145)

## 2021-02-06 LAB — SARS CORONAVIRUS 2 (TAT 6-24 HRS): SARS Coronavirus 2: POSITIVE — AB

## 2021-02-06 MED ORDER — BENZONATATE 100 MG PO CAPS: 100.0000 mg | ORAL_CAPSULE | Freq: Three times a day (TID) | ORAL | 0 refills | Status: DC | PRN

## 2021-02-06 MED ORDER — PROMETHAZINE-DM 6.25-15 MG/5ML PO SYRP
5.0000 mL | ORAL_SOLUTION | Freq: Every evening | ORAL | 0 refills | Status: DC | PRN
Start: 2021-02-06 — End: 2022-01-09

## 2021-02-06 NOTE — Discharge Instructions (Addendum)
We will notify you of your test results as they arrive and may take between 48-72 hours.  I encourage you to sign up for MyChart if you have not already done so as this can be the easiest way for [place] to communicate results to you online or through a phone app.  Generally, we only contact you if it is a positive test result.  In the meantime, if you develop worsening symptoms including fever, chest pain, shortness of breath despite our current treatment plan then please report to the emergency room as this may be a sign of worsening status from possible viral infection.  Otherwise, we will manage this as a viral syndrome. For sore throat or cough try using a honey-based tea. Use 3 teaspoons of honey with juice squeezed from half lemon. Place shaved pieces of ginger into 1/2-1 cup of water and warm over stove top. Then mix the ingredients and repeat every 4 hours as needed. Please take Tylenol 500mg -650mg  every 6 hours for aches and pains, fevers. Hydrate very well with at least 2 liters of water. Eat light meals such as soups to replenish electrolytes and soft fruits, veggies. Start an antihistamine like Zyrtec for postnasal drainage, sinus congestion.  You can take this together with the cough medications I prescribed.

## 2021-02-06 NOTE — ED Triage Notes (Signed)
Pt reports Sx's started yesterday  with sore throat,cough and hoarse. Pt reports a positive home COVID test.

## 2021-02-06 NOTE — ED Provider Notes (Signed)
Kevin [name]   MRN: [id number] DOB: [birth year]  Subjective:   Kevin [name] is a 86 y.o. male presenting for 2-day history of acute onset mild throat pain, scratchy throat, mild cough.  Patient tested himself at home and was positive for COVID.  He would like a confirmation test today.  Denies fevers, body aches, chest pain, shortness of breath, hemoptysis, wheezing, nausea, vomiting, abdominal pain.  No history of respiratory disorders, COPD, asthma.  Has a remote history of smoking.  He is a type II diabetic and is well controlled, not treated with insulin.  He also has a history of dyslipidemia, hypertension and a history of CKD although his recent blood work has shown that his creatinine levels and GFR have been okay.  No current facility-administered medications for this encounter.  Current Outpatient Medications:    acetaminophen (TYLENOL) 325 MG tablet, Take 2 tablets (650 mg total) by mouth every 6 (six) hours as needed for mild pain., Disp: , Rfl:    ascorbic acid (VITAMIN C) 500 MG tablet, Take 500 mg by mouth daily., Disp: , Rfl:    aspirin 81 MG tablet, Take 81 mg by mouth daily., Disp: , Rfl:    atorvastatin (LIPITOR) 20 MG tablet, Take 20 mg by mouth at bedtime., Disp: , Rfl:    docusate sodium (COLACE) 100 MG capsule, Take 100 mg by mouth in the morning and at bedtime., Disp: , Rfl:    famotidine (PEPCID) 20 MG tablet, Take 20 mg by mouth 2 (two) times daily., Disp: , Rfl:    hydrochlorothiazide (MICROZIDE) 12.5 MG capsule, Take 12.5 mg by mouth daily., Disp: , Rfl:    latanoprost (XALATAN) 0.005 % ophthalmic solution, Place 1 drop into both eyes at bedtime., Disp: , Rfl:    levothyroxine (SYNTHROID) 50 MCG tablet, Take 50 mcg by mouth daily before breakfast., Disp: , Rfl:    lisinopril (ZESTRIL) 40 MG tablet, Take 40 mg by mouth daily., Disp: , Rfl:    metFORMIN (GLUCOPHAGE) 500 MG tablet, Take 500 mg by mouth daily with breakfast. , Disp: , Rfl:     Multiple Vitamin (MULTIVITAMIN) capsule, Take 1 capsule by mouth daily., Disp: , Rfl:    Propylene Glycol (SYSTANE BALANCE) 0.6 % SOLN, Place 1 drop into both eyes daily as needed (dry eyes)., Disp: , Rfl:    vitamin E 200 UNIT capsule, Take 200 Units by mouth daily., Disp: , Rfl:    Allergies  Allergen Reactions   Amlodipine Swelling   Metoprolol Succinate [Metoprolol] Other (See Comments)    Heart slows down    Past Medical History:  Diagnosis Date   Blood dyscrasia    chronic thrombocytopenia   Chronic kidney disease    Chronic venous insufficiency    Diabetes mellitus type 2 in nonobese (HCC)    GERD (gastroesophageal reflux disease)    HLD (hyperlipidemia) 11/05/2018   Hypertension    OSA on CPAP    PAD (peripheral artery disease) ([place])    Pre-syncope 11/05/2018   Symptomatic bradycardia 11/03/2018     Past Surgical History:  Procedure Laterality Date   CARPAL TUNNEL RELEASE Left 04/28/2015   Procedure: LEFT WRIST CARPAL TUNNEL RELEASE;  Surgeon: Melrose Nakayama, MD;  Location: [facility];  Service: Orthopedics;  Laterality: Left;   COLONOSCOPY WITH PROPOFOL N/A 08/01/2020   Procedure: COLONOSCOPY WITH PROPOFOL;  Surgeon: Wilford Corner, MD;  Location: [facility];  Service: Endoscopy;  Laterality: N/A;   HOT HEMOSTASIS N/A  08/01/2020   Procedure: HOT HEMOSTASIS (ARGON PLASMA COAGULATION/BICAP);  Surgeon: Wilford Corner, MD;  Location: [facility];  Service: Endoscopy;  Laterality: N/A;   POLYPECTOMY  08/01/2020   Procedure: POLYPECTOMY;  Surgeon: Wilford Corner, MD;  Location: [facility];  Service: Endoscopy;;   SEPTOPLASTY      Family History  Problem Relation Age of Onset   Heart attack Mother 9   Stroke Father 16    Social History   Tobacco Use   Smoking status: Former    Types: Cigarettes   Smokeless tobacco: Former  Substance Use Topics   Alcohol use: No   Drug use: No    ROS   Objective:   Vitals: BP (!) 161/80    Pulse  86    Temp 99 F (37.2 C)    Resp 18    SpO2 99%   Physical Exam Constitutional:      General: He is not in acute distress.    Appearance: Normal appearance. He is well-developed. He is not ill-appearing, toxic-appearing or diaphoretic.  HENT:     Head: Normocephalic and atraumatic.     Right Ear: External ear normal.     Left Ear: External ear normal.     Nose: Nose normal.     Mouth/Throat:     Mouth: Mucous membranes are moist.     Pharynx: No oropharyngeal exudate or posterior oropharyngeal erythema.     Comments: Mild drainage overlying the pharynx. Eyes:     General: No scleral icterus.       Right eye: No discharge.        Left eye: No discharge.     Extraocular Movements: Extraocular movements intact.     Conjunctiva/sclera: Conjunctivae normal.  Cardiovascular:     Rate and Rhythm: Normal rate and regular rhythm.     Heart sounds: Normal heart sounds. No murmur heard.   No friction rub. No gallop.  Pulmonary:     Effort: Pulmonary effort is normal. No respiratory distress.     Breath sounds: Normal breath sounds. No stridor. No wheezing, rhonchi or rales.  Skin:    General: Skin is warm and dry.  Neurological:     Mental Status: He is alert and oriented to person, place, and time.  Psychiatric:        Mood and Affect: Mood normal.        Behavior: Behavior normal.        Thought Content: Thought content normal.        Judgment: Judgment normal.    Assessment and Plan :   PDMP not reviewed this encounter.  1. Acute viral syndrome   2. Subacute cough   3. Sore throat   4. Type 2 diabetes mellitus treated without insulin ([place])   5. Essential hypertension     Deferred imaging given clear cardiopulmonary exam, hemodynamically stable vital signs.  We had an extensive discussion about using COVID antiviral medications.  For now patient prefers to hold off on this.  He was agreeable to the blood work to evaluate his creatinine level in case he decides he does want  to use Paxlovid.  Confirmation testing pending.  Recommend supportive care otherwise. Counseled patient on potential for adverse effects with medications prescribed/recommended today, ER and return-to-clinic precautions discussed, patient verbalized understanding.    Jaynee Eagles, [place] 02/06/21 1615

## 2021-02-27 DIAGNOSIS — H4089 Other specified glaucoma: Secondary | ICD-10-CM | POA: Diagnosis not present

## 2021-02-27 DIAGNOSIS — D6869 Other thrombophilia: Secondary | ICD-10-CM | POA: Diagnosis not present

## 2021-02-27 DIAGNOSIS — E782 Mixed hyperlipidemia: Secondary | ICD-10-CM | POA: Diagnosis not present

## 2021-02-27 DIAGNOSIS — I10 Essential (primary) hypertension: Secondary | ICD-10-CM | POA: Diagnosis not present

## 2021-02-27 DIAGNOSIS — Z0000 Encounter for general adult medical examination without abnormal findings: Secondary | ICD-10-CM | POA: Diagnosis not present

## 2021-02-27 DIAGNOSIS — E1151 Type 2 diabetes mellitus with diabetic peripheral angiopathy without gangrene: Secondary | ICD-10-CM | POA: Diagnosis not present

## 2021-02-27 DIAGNOSIS — K5900 Constipation, unspecified: Secondary | ICD-10-CM | POA: Diagnosis not present

## 2021-02-27 DIAGNOSIS — E039 Hypothyroidism, unspecified: Secondary | ICD-10-CM | POA: Diagnosis not present

## 2021-02-27 DIAGNOSIS — E1169 Type 2 diabetes mellitus with other specified complication: Secondary | ICD-10-CM | POA: Diagnosis not present

## 2021-03-23 DIAGNOSIS — M65332 Trigger finger, left middle finger: Secondary | ICD-10-CM | POA: Diagnosis not present

## 2021-04-02 DIAGNOSIS — I10 Essential (primary) hypertension: Secondary | ICD-10-CM | POA: Diagnosis not present

## 2021-04-12 DIAGNOSIS — M65351 Trigger finger, right little finger: Secondary | ICD-10-CM | POA: Diagnosis not present

## 2021-04-16 DIAGNOSIS — I10 Essential (primary) hypertension: Secondary | ICD-10-CM | POA: Diagnosis not present

## 2021-04-16 DIAGNOSIS — E782 Mixed hyperlipidemia: Secondary | ICD-10-CM | POA: Diagnosis not present

## 2021-04-16 DIAGNOSIS — E1151 Type 2 diabetes mellitus with diabetic peripheral angiopathy without gangrene: Secondary | ICD-10-CM | POA: Diagnosis not present

## 2021-05-03 DIAGNOSIS — M1712 Unilateral primary osteoarthritis, left knee: Secondary | ICD-10-CM | POA: Diagnosis not present

## 2021-05-03 DIAGNOSIS — M1711 Unilateral primary osteoarthritis, right knee: Secondary | ICD-10-CM | POA: Diagnosis not present

## 2021-05-04 DIAGNOSIS — G4733 Obstructive sleep apnea (adult) (pediatric): Secondary | ICD-10-CM | POA: Diagnosis not present

## 2021-05-14 DIAGNOSIS — I10 Essential (primary) hypertension: Secondary | ICD-10-CM | POA: Diagnosis not present

## 2021-05-14 DIAGNOSIS — E039 Hypothyroidism, unspecified: Secondary | ICD-10-CM | POA: Diagnosis not present

## 2021-05-14 DIAGNOSIS — H4089 Other specified glaucoma: Secondary | ICD-10-CM | POA: Diagnosis not present

## 2021-05-14 DIAGNOSIS — E1151 Type 2 diabetes mellitus with diabetic peripheral angiopathy without gangrene: Secondary | ICD-10-CM | POA: Diagnosis not present

## 2021-05-14 DIAGNOSIS — E782 Mixed hyperlipidemia: Secondary | ICD-10-CM | POA: Diagnosis not present

## 2021-05-15 DIAGNOSIS — M65351 Trigger finger, right little finger: Secondary | ICD-10-CM | POA: Diagnosis not present

## 2021-05-15 DIAGNOSIS — M65332 Trigger finger, left middle finger: Secondary | ICD-10-CM | POA: Diagnosis not present

## 2021-05-15 DIAGNOSIS — Z9889 Other specified postprocedural states: Secondary | ICD-10-CM | POA: Diagnosis not present

## 2021-06-08 DIAGNOSIS — D3132 Benign neoplasm of left choroid: Secondary | ICD-10-CM | POA: Diagnosis not present

## 2021-06-08 DIAGNOSIS — E119 Type 2 diabetes mellitus without complications: Secondary | ICD-10-CM | POA: Diagnosis not present

## 2021-06-08 DIAGNOSIS — H02831 Dermatochalasis of right upper eyelid: Secondary | ICD-10-CM | POA: Diagnosis not present

## 2021-06-08 DIAGNOSIS — H02834 Dermatochalasis of left upper eyelid: Secondary | ICD-10-CM | POA: Diagnosis not present

## 2021-06-08 DIAGNOSIS — H401131 Primary open-angle glaucoma, bilateral, mild stage: Secondary | ICD-10-CM | POA: Diagnosis not present

## 2021-06-08 DIAGNOSIS — H2513 Age-related nuclear cataract, bilateral: Secondary | ICD-10-CM | POA: Diagnosis not present

## 2021-07-10 DIAGNOSIS — M65352 Trigger finger, left little finger: Secondary | ICD-10-CM | POA: Diagnosis not present

## 2021-09-06 DIAGNOSIS — K5900 Constipation, unspecified: Secondary | ICD-10-CM | POA: Diagnosis not present

## 2021-09-06 DIAGNOSIS — K648 Other hemorrhoids: Secondary | ICD-10-CM | POA: Diagnosis not present

## 2021-09-06 DIAGNOSIS — K625 Hemorrhage of anus and rectum: Secondary | ICD-10-CM | POA: Diagnosis not present

## 2021-09-18 DIAGNOSIS — M25511 Pain in right shoulder: Secondary | ICD-10-CM | POA: Diagnosis not present

## 2021-09-18 DIAGNOSIS — S46811A Strain of other muscles, fascia and tendons at shoulder and upper arm level, right arm, initial encounter: Secondary | ICD-10-CM | POA: Diagnosis not present

## 2021-09-18 DIAGNOSIS — M4692 Unspecified inflammatory spondylopathy, cervical region: Secondary | ICD-10-CM | POA: Diagnosis not present

## 2021-09-19 DIAGNOSIS — S161XXD Strain of muscle, fascia and tendon at neck level, subsequent encounter: Secondary | ICD-10-CM | POA: Diagnosis not present

## 2021-09-19 DIAGNOSIS — M47812 Spondylosis without myelopathy or radiculopathy, cervical region: Secondary | ICD-10-CM | POA: Diagnosis not present

## 2021-09-25 DIAGNOSIS — S161XXD Strain of muscle, fascia and tendon at neck level, subsequent encounter: Secondary | ICD-10-CM | POA: Diagnosis not present

## 2021-09-25 DIAGNOSIS — M47812 Spondylosis without myelopathy or radiculopathy, cervical region: Secondary | ICD-10-CM | POA: Diagnosis not present

## 2021-09-27 DIAGNOSIS — M47812 Spondylosis without myelopathy or radiculopathy, cervical region: Secondary | ICD-10-CM | POA: Diagnosis not present

## 2021-09-27 DIAGNOSIS — S161XXD Strain of muscle, fascia and tendon at neck level, subsequent encounter: Secondary | ICD-10-CM | POA: Diagnosis not present

## 2021-10-02 DIAGNOSIS — E1169 Type 2 diabetes mellitus with other specified complication: Secondary | ICD-10-CM | POA: Diagnosis not present

## 2021-10-02 DIAGNOSIS — I10 Essential (primary) hypertension: Secondary | ICD-10-CM | POA: Diagnosis not present

## 2021-10-02 DIAGNOSIS — E782 Mixed hyperlipidemia: Secondary | ICD-10-CM | POA: Diagnosis not present

## 2021-10-02 DIAGNOSIS — I739 Peripheral vascular disease, unspecified: Secondary | ICD-10-CM | POA: Diagnosis not present

## 2021-10-02 DIAGNOSIS — Z23 Encounter for immunization: Secondary | ICD-10-CM | POA: Diagnosis not present

## 2021-10-02 DIAGNOSIS — R2689 Other abnormalities of gait and mobility: Secondary | ICD-10-CM | POA: Diagnosis not present

## 2021-10-02 DIAGNOSIS — M47812 Spondylosis without myelopathy or radiculopathy, cervical region: Secondary | ICD-10-CM | POA: Diagnosis not present

## 2021-10-02 DIAGNOSIS — S161XXD Strain of muscle, fascia and tendon at neck level, subsequent encounter: Secondary | ICD-10-CM | POA: Diagnosis not present

## 2021-10-04 DIAGNOSIS — M65351 Trigger finger, right little finger: Secondary | ICD-10-CM | POA: Diagnosis not present

## 2021-10-04 DIAGNOSIS — S161XXD Strain of muscle, fascia and tendon at neck level, subsequent encounter: Secondary | ICD-10-CM | POA: Diagnosis not present

## 2021-10-04 DIAGNOSIS — M47812 Spondylosis without myelopathy or radiculopathy, cervical region: Secondary | ICD-10-CM | POA: Diagnosis not present

## 2021-10-08 DIAGNOSIS — S60561A Insect bite (nonvenomous) of right hand, initial encounter: Secondary | ICD-10-CM | POA: Diagnosis not present

## 2021-10-08 DIAGNOSIS — W57XXXA Bitten or stung by nonvenomous insect and other nonvenomous arthropods, initial encounter: Secondary | ICD-10-CM | POA: Diagnosis not present

## 2021-10-09 DIAGNOSIS — S161XXD Strain of muscle, fascia and tendon at neck level, subsequent encounter: Secondary | ICD-10-CM | POA: Diagnosis not present

## 2021-10-09 DIAGNOSIS — H401131 Primary open-angle glaucoma, bilateral, mild stage: Secondary | ICD-10-CM | POA: Diagnosis not present

## 2021-10-09 DIAGNOSIS — D3132 Benign neoplasm of left choroid: Secondary | ICD-10-CM | POA: Diagnosis not present

## 2021-10-09 DIAGNOSIS — H02834 Dermatochalasis of left upper eyelid: Secondary | ICD-10-CM | POA: Diagnosis not present

## 2021-10-09 DIAGNOSIS — H02831 Dermatochalasis of right upper eyelid: Secondary | ICD-10-CM | POA: Diagnosis not present

## 2021-10-09 DIAGNOSIS — E119 Type 2 diabetes mellitus without complications: Secondary | ICD-10-CM | POA: Diagnosis not present

## 2021-10-09 DIAGNOSIS — M47812 Spondylosis without myelopathy or radiculopathy, cervical region: Secondary | ICD-10-CM | POA: Diagnosis not present

## 2021-10-09 DIAGNOSIS — H2511 Age-related nuclear cataract, right eye: Secondary | ICD-10-CM | POA: Diagnosis not present

## 2021-10-10 DIAGNOSIS — H4089 Other specified glaucoma: Secondary | ICD-10-CM | POA: Diagnosis not present

## 2021-10-10 DIAGNOSIS — E1151 Type 2 diabetes mellitus with diabetic peripheral angiopathy without gangrene: Secondary | ICD-10-CM | POA: Diagnosis not present

## 2021-10-10 DIAGNOSIS — E782 Mixed hyperlipidemia: Secondary | ICD-10-CM | POA: Diagnosis not present

## 2021-10-10 DIAGNOSIS — E039 Hypothyroidism, unspecified: Secondary | ICD-10-CM | POA: Diagnosis not present

## 2021-10-10 DIAGNOSIS — E1169 Type 2 diabetes mellitus with other specified complication: Secondary | ICD-10-CM | POA: Diagnosis not present

## 2021-10-10 DIAGNOSIS — I10 Essential (primary) hypertension: Secondary | ICD-10-CM | POA: Diagnosis not present

## 2021-10-16 DIAGNOSIS — M47812 Spondylosis without myelopathy or radiculopathy, cervical region: Secondary | ICD-10-CM | POA: Diagnosis not present

## 2021-10-16 DIAGNOSIS — S161XXD Strain of muscle, fascia and tendon at neck level, subsequent encounter: Secondary | ICD-10-CM | POA: Diagnosis not present

## 2021-10-18 DIAGNOSIS — S161XXD Strain of muscle, fascia and tendon at neck level, subsequent encounter: Secondary | ICD-10-CM | POA: Diagnosis not present

## 2021-10-18 DIAGNOSIS — M47812 Spondylosis without myelopathy or radiculopathy, cervical region: Secondary | ICD-10-CM | POA: Diagnosis not present

## 2021-11-01 DIAGNOSIS — H401111 Primary open-angle glaucoma, right eye, mild stage: Secondary | ICD-10-CM | POA: Diagnosis not present

## 2021-11-01 DIAGNOSIS — H2511 Age-related nuclear cataract, right eye: Secondary | ICD-10-CM | POA: Diagnosis not present

## 2021-11-02 DIAGNOSIS — M542 Cervicalgia: Secondary | ICD-10-CM | POA: Diagnosis not present

## 2021-11-12 DIAGNOSIS — H2512 Age-related nuclear cataract, left eye: Secondary | ICD-10-CM | POA: Diagnosis not present

## 2021-11-13 DIAGNOSIS — M47812 Spondylosis without myelopathy or radiculopathy, cervical region: Secondary | ICD-10-CM | POA: Diagnosis not present

## 2021-11-14 DIAGNOSIS — M47812 Spondylosis without myelopathy or radiculopathy, cervical region: Secondary | ICD-10-CM | POA: Diagnosis not present

## 2021-11-15 DIAGNOSIS — H2512 Age-related nuclear cataract, left eye: Secondary | ICD-10-CM | POA: Diagnosis not present

## 2021-11-15 DIAGNOSIS — H401123 Primary open-angle glaucoma, left eye, severe stage: Secondary | ICD-10-CM | POA: Diagnosis not present

## 2021-11-22 DIAGNOSIS — M47812 Spondylosis without myelopathy or radiculopathy, cervical region: Secondary | ICD-10-CM | POA: Diagnosis not present

## 2021-11-27 DIAGNOSIS — M47812 Spondylosis without myelopathy or radiculopathy, cervical region: Secondary | ICD-10-CM | POA: Diagnosis not present

## 2021-11-29 DIAGNOSIS — M47812 Spondylosis without myelopathy or radiculopathy, cervical region: Secondary | ICD-10-CM | POA: Diagnosis not present

## 2021-12-04 DIAGNOSIS — M47812 Spondylosis without myelopathy or radiculopathy, cervical region: Secondary | ICD-10-CM | POA: Diagnosis not present

## 2021-12-06 DIAGNOSIS — M47812 Spondylosis without myelopathy or radiculopathy, cervical region: Secondary | ICD-10-CM | POA: Diagnosis not present

## 2021-12-10 DIAGNOSIS — M47812 Spondylosis without myelopathy or radiculopathy, cervical region: Secondary | ICD-10-CM | POA: Diagnosis not present

## 2021-12-12 DIAGNOSIS — M47812 Spondylosis without myelopathy or radiculopathy, cervical region: Secondary | ICD-10-CM | POA: Diagnosis not present

## 2021-12-18 DIAGNOSIS — M47812 Spondylosis without myelopathy or radiculopathy, cervical region: Secondary | ICD-10-CM | POA: Diagnosis not present

## 2021-12-19 DIAGNOSIS — M47812 Spondylosis without myelopathy or radiculopathy, cervical region: Secondary | ICD-10-CM | POA: Diagnosis not present

## 2021-12-20 DIAGNOSIS — M47812 Spondylosis without myelopathy or radiculopathy, cervical region: Secondary | ICD-10-CM | POA: Diagnosis not present

## 2021-12-25 DIAGNOSIS — M47812 Spondylosis without myelopathy or radiculopathy, cervical region: Secondary | ICD-10-CM | POA: Diagnosis not present

## 2021-12-27 DIAGNOSIS — M47812 Spondylosis without myelopathy or radiculopathy, cervical region: Secondary | ICD-10-CM | POA: Diagnosis not present

## 2021-12-28 DIAGNOSIS — E1169 Type 2 diabetes mellitus with other specified complication: Secondary | ICD-10-CM | POA: Diagnosis not present

## 2021-12-28 DIAGNOSIS — I10 Essential (primary) hypertension: Secondary | ICD-10-CM | POA: Diagnosis not present

## 2021-12-28 DIAGNOSIS — I739 Peripheral vascular disease, unspecified: Secondary | ICD-10-CM | POA: Diagnosis not present

## 2021-12-31 DIAGNOSIS — K648 Other hemorrhoids: Secondary | ICD-10-CM | POA: Diagnosis not present

## 2021-12-31 DIAGNOSIS — K5900 Constipation, unspecified: Secondary | ICD-10-CM | POA: Diagnosis not present

## 2022-01-01 DIAGNOSIS — M4692 Unspecified inflammatory spondylopathy, cervical region: Secondary | ICD-10-CM | POA: Diagnosis not present

## 2022-01-01 DIAGNOSIS — M542 Cervicalgia: Secondary | ICD-10-CM | POA: Diagnosis not present

## 2022-01-01 DIAGNOSIS — Z0189 Encounter for other specified special examinations: Secondary | ICD-10-CM | POA: Diagnosis not present

## 2022-01-03 DIAGNOSIS — M47812 Spondylosis without myelopathy or radiculopathy, cervical region: Secondary | ICD-10-CM | POA: Diagnosis not present

## 2022-01-04 ENCOUNTER — Ambulatory Visit: Payer: Medicare Other | Admitting: Cardiology

## 2022-01-07 DIAGNOSIS — M542 Cervicalgia: Secondary | ICD-10-CM | POA: Diagnosis not present

## 2022-01-08 DIAGNOSIS — M47812 Spondylosis without myelopathy or radiculopathy, cervical region: Secondary | ICD-10-CM | POA: Diagnosis not present

## 2022-01-09 ENCOUNTER — Ambulatory Visit: Payer: Medicare Other | Attending: Cardiology | Admitting: Cardiology

## 2022-01-09 ENCOUNTER — Encounter: Payer: Self-pay | Admitting: Cardiology

## 2022-01-09 VITALS — BP 132/68 | HR 68 | Ht 68.5 in | Wt 172.8 lb

## 2022-01-09 DIAGNOSIS — I452 Bifascicular block: Secondary | ICD-10-CM | POA: Diagnosis not present

## 2022-01-09 DIAGNOSIS — I10 Essential (primary) hypertension: Secondary | ICD-10-CM | POA: Diagnosis not present

## 2022-01-09 DIAGNOSIS — G4733 Obstructive sleep apnea (adult) (pediatric): Secondary | ICD-10-CM

## 2022-01-09 NOTE — Progress Notes (Signed)
Date:  01/09/2022   ID:  Kevin [name], DOB 1935/01/23, MRN [id number]   PCP:  Kathyrn Lass, MD  Cardiologist:  Fransico Him, MD  Electrophysiologist:  None   Chief Complaint:  OSA, HT  History of Present Illness:    Kevin [name] is a 86 y.o. male with thrombocytopenia, DM, HTN, GERD, PAD and chronic venous insufficiency (managed conservatively by Dr. Donzetta Matters), sinus bradycardia, trifascicular block with chronic RBBB, former tobacco use. He also has mild OSA with  an AHI of 9.6/hr and O2 sats as low as 81%. When I saw him last we decided to treat his OSA with CPAP and he is now back for followup after getting on PAP therapy.    He is doing well with his PAP device and thinks that he has gotten used to it.  He tolerates the nasal mask and feels the pressure is adequate.  Since going on PAP he feels rested in the am and has no significant daytime sleepiness.  He denies any significant nasal dryness or nasal congestion.  He does have mouth dryness but has not been using his chin strap. He does not think that he snores.    He is here today for followup and is doing well.  He denies any chest pain or pressure, SOB, DOE, PND, orthopnea, LE edema, dizziness, palpitations or syncope. He is compliant with his meds and is tolerating meds with no SE.    Prior CV studies:   The following studies were reviewed today:  PAP compliance download  Past Medical History:  Diagnosis Date   Blood dyscrasia    chronic thrombocytopenia   Chronic kidney disease    Chronic venous insufficiency    Diabetes mellitus type 2 in nonobese (HCC)    GERD (gastroesophageal reflux disease)    HLD (hyperlipidemia) 11/05/2018   Hypertension    OSA on CPAP    PAD (peripheral artery disease) ([place])    Pre-syncope 11/05/2018   Symptomatic bradycardia 11/03/2018   Past Surgical History:  Procedure Laterality Date   CARPAL TUNNEL RELEASE Left 04/28/2015   Procedure: LEFT WRIST CARPAL TUNNEL RELEASE;  Surgeon: Melrose Nakayama, MD;  Location: [facility];  Service: Orthopedics;  Laterality: Left;   COLONOSCOPY WITH PROPOFOL N/A 08/01/2020   Procedure: COLONOSCOPY WITH PROPOFOL;  Surgeon: Wilford Corner, MD;  Location: [facility];  Service: Endoscopy;  Laterality: N/A;   HOT HEMOSTASIS N/A 08/01/2020   Procedure: HOT HEMOSTASIS (ARGON PLASMA COAGULATION/BICAP);  Surgeon: Wilford Corner, MD;  Location: [facility];  Service: Endoscopy;  Laterality: N/A;   POLYPECTOMY  08/01/2020   Procedure: POLYPECTOMY;  Surgeon: Wilford Corner, MD;  Location: [facility];  Service: Endoscopy;;   SEPTOPLASTY       Current Meds  Medication Sig   acetaminophen (TYLENOL) 325 MG tablet Take 2 tablets (650 mg total) by mouth every 6 (six) hours as needed for mild pain.   ascorbic acid (VITAMIN C) 500 MG tablet Take 500 mg by mouth daily.   aspirin 81 MG tablet Take 81 mg by mouth daily.   atorvastatin (LIPITOR) 20 MG tablet Take 20 mg by mouth at bedtime.   docusate sodium (COLACE) 100 MG capsule Take 100 mg by mouth in the morning and at bedtime.   famotidine (PEPCID) 20 MG tablet Take 20 mg by mouth 2 (two) times daily.   hydrochlorothiazide (HYDRODIURIL) 25 MG tablet Take 25 mg by mouth daily.   latanoprost (XALATAN) 0.005 % ophthalmic solution Place 1 drop into  both eyes at bedtime.   levothyroxine (SYNTHROID) 50 MCG tablet Take 50 mcg by mouth daily before breakfast.   lisinopril (ZESTRIL) 40 MG tablet Take 40 mg by mouth daily.   metFORMIN (GLUCOPHAGE) 500 MG tablet Take 500 mg by mouth daily with breakfast.    methocarbamol (ROBAXIN) 500 MG tablet Take 500-1,000 mg by mouth 3 (three) times daily as needed.   Multiple Vitamin (MULTIVITAMIN) capsule Take 1 capsule by mouth daily.   Propylene Glycol (SYSTANE BALANCE) 0.6 % SOLN Place 1 drop into both eyes daily as needed (dry eyes).   vitamin E 200 UNIT capsule Take 200 Units by mouth daily.   [DISCONTINUED] hydrochlorothiazide (MICROZIDE) 12.5 MG  capsule Take 12.5 mg by mouth daily.   [DISCONTINUED] promethazine-dextromethorphan (PROMETHAZINE-DM) 6.25-15 MG/5ML syrup Take 5 mLs by mouth at bedtime as needed for cough.     Allergies:   Amlodipine and Metoprolol succinate [metoprolol]   Social History   Tobacco Use   Smoking status: Former    Types: Cigarettes   Smokeless tobacco: Former  Substance Use Topics   Alcohol use: No   Drug use: No     Family Hx: The patient's family history includes Heart attack (age of onset: 52) in his mother; Stroke (age of onset: 16) in his father.  ROS:   Please see the history of present illness.     All other systems reviewed and are negative.   Labs/Other Tests and Data Reviewed:    Recent Labs: 02/06/2021: BUN 17; Creatinine, Ser 1.06; Potassium 3.7; Sodium 140   Recent Lipid Panel No results found for: "CHOL", "TRIG", "HDL", "CHOLHDL", "LDLCALC", "LDLDIRECT"  Wt Readings from Last 3 Encounters:  01/09/22 172 lb 12.8 oz (78.4 kg)  08/11/20 175 lb (79.4 kg)  08/01/20 173 lb 15.1 oz (78.9 kg)     Objective:    Vital Signs:  BP 132/68   Pulse 68   Ht 5' 8.5" (1.74 m)   Wt 172 lb 12.8 oz (78.4 kg)   SpO2 98%   BMI 25.89 kg/m    GEN: Well nourished, well developed in no acute distress HEENT: Normal NECK: No JVD; No carotid bruits LYMPHATICS: No lymphadenopathy CARDIAC:RRR, no murmurs, rubs, gallops RESPIRATORY:  Clear to auscultation without rales, wheezing or rhonchi  ABDOMEN: Soft, non-tender, non-distended MUSCULOSKELETAL:  No edema; No deformity  SKIN: Warm and dry NEUROLOGIC:  Alert and oriented x 3 PSYCHIATRIC:  Normal affect   EKG was  done today demonstrating NSR with RBBB and LAFB with LVH by voltage and septal infarct  ASSESSMENT & PLAN:    1.  OSA - The patient is tolerating PAP therapy well without any problems. The PAP download performed by his DME was personally reviewed and interpreted by me today and showed an AHI of 1.5/hr on auto CPAP from 4-18 cm  H2O with 100% compliance in using more than 4 hours nightly.  The patient has been using and benefiting from PAP use and will continue to benefit from therapy.  -encouraged him to use his chin strap to help with mouth dryness   2.  HTN -BP is well-controlled on exam today -Continue drug management with lisinopril 40 mg daily with as needed refills   3.  RBBB with LAFB -no change in EKG today   Medication Adjustments/Labs and Tests Ordered: Current medicines are reviewed at length with the patient today.  Concerns regarding medicines are outlined above.  Tests Ordered: Orders Placed This Encounter  Procedures   EKG 12-Lead  Medication Changes: No orders of the defined types were placed in this encounter.    Disposition:  Follow up 1 year  Signed, Fransico Him, MD  01/09/2022 9:38 AM    [place]

## 2022-01-09 NOTE — Patient Instructions (Signed)
Medication Instructions:  Your physician recommends that you continue on your current medications as directed. Please refer to the Current Medication list given to you today.  *If you need a refill on your cardiac medications before your next appointment, please call your pharmacy*  Lab Work: If you have labs (blood work) drawn today and your tests are completely normal, you will receive your results only by: [place] (if you have MyChart) OR A paper copy in the mail If you have any lab test that is abnormal or we need to change your treatment, we will call you to review the results.  Follow-Up: At [facility], you and your health needs are our priority.  As part of our continuing mission to provide you with exceptional heart care, we have created designated Provider Care Teams.  These Care Teams include your primary Cardiologist (physician) and Advanced Practice Providers (APPs -  Physician Assistants and Nurse Practitioners) who all work together to provide you with the care you need, when you need it.  We recommend signing up for the patient portal called "MyChart".  Sign up information is provided on this After Visit Summary.  MyChart is used to connect with patients for Virtual Visits (Telemedicine).  Patients are able to view lab/test results, encounter notes, upcoming appointments, etc.  Non-urgent messages can be sent to your provider as well.   To learn more about what you can do with MyChart, go to [email or web address].    Your next appointment:   1 year(s)  The format for your next appointment:   In Person  Provider:   Fransico Him, MD       Important Information About Sugar

## 2022-01-10 DIAGNOSIS — M47812 Spondylosis without myelopathy or radiculopathy, cervical region: Secondary | ICD-10-CM | POA: Diagnosis not present

## 2022-01-15 DIAGNOSIS — M47812 Spondylosis without myelopathy or radiculopathy, cervical region: Secondary | ICD-10-CM | POA: Diagnosis not present

## 2022-01-23 DIAGNOSIS — M47812 Spondylosis without myelopathy or radiculopathy, cervical region: Secondary | ICD-10-CM | POA: Diagnosis not present

## 2022-01-29 DIAGNOSIS — M47812 Spondylosis without myelopathy or radiculopathy, cervical region: Secondary | ICD-10-CM | POA: Diagnosis not present

## 2022-01-30 DIAGNOSIS — M47812 Spondylosis without myelopathy or radiculopathy, cervical region: Secondary | ICD-10-CM | POA: Diagnosis not present

## 2022-01-30 DIAGNOSIS — E039 Hypothyroidism, unspecified: Secondary | ICD-10-CM | POA: Diagnosis not present

## 2022-01-30 DIAGNOSIS — E1169 Type 2 diabetes mellitus with other specified complication: Secondary | ICD-10-CM | POA: Diagnosis not present

## 2022-01-30 DIAGNOSIS — I10 Essential (primary) hypertension: Secondary | ICD-10-CM | POA: Diagnosis not present

## 2022-01-30 DIAGNOSIS — H4089 Other specified glaucoma: Secondary | ICD-10-CM | POA: Diagnosis not present

## 2022-01-30 DIAGNOSIS — E782 Mixed hyperlipidemia: Secondary | ICD-10-CM | POA: Diagnosis not present

## 2022-01-31 DIAGNOSIS — M47812 Spondylosis without myelopathy or radiculopathy, cervical region: Secondary | ICD-10-CM | POA: Diagnosis not present

## 2022-02-05 DIAGNOSIS — M47812 Spondylosis without myelopathy or radiculopathy, cervical region: Secondary | ICD-10-CM | POA: Diagnosis not present

## 2022-02-07 DIAGNOSIS — M47812 Spondylosis without myelopathy or radiculopathy, cervical region: Secondary | ICD-10-CM | POA: Diagnosis not present

## 2022-02-12 DIAGNOSIS — M47812 Spondylosis without myelopathy or radiculopathy, cervical region: Secondary | ICD-10-CM | POA: Diagnosis not present

## 2022-02-14 DIAGNOSIS — M47812 Spondylosis without myelopathy or radiculopathy, cervical region: Secondary | ICD-10-CM | POA: Diagnosis not present

## 2022-02-25 DIAGNOSIS — M47812 Spondylosis without myelopathy or radiculopathy, cervical region: Secondary | ICD-10-CM | POA: Diagnosis not present

## 2022-03-01 DIAGNOSIS — Z23 Encounter for immunization: Secondary | ICD-10-CM | POA: Diagnosis not present

## 2022-03-01 DIAGNOSIS — Z0000 Encounter for general adult medical examination without abnormal findings: Secondary | ICD-10-CM | POA: Diagnosis not present

## 2022-03-05 DIAGNOSIS — I10 Essential (primary) hypertension: Secondary | ICD-10-CM | POA: Diagnosis not present

## 2022-03-05 DIAGNOSIS — I739 Peripheral vascular disease, unspecified: Secondary | ICD-10-CM | POA: Diagnosis not present

## 2022-03-05 DIAGNOSIS — E1151 Type 2 diabetes mellitus with diabetic peripheral angiopathy without gangrene: Secondary | ICD-10-CM | POA: Diagnosis not present

## 2022-03-05 DIAGNOSIS — D6869 Other thrombophilia: Secondary | ICD-10-CM | POA: Diagnosis not present

## 2022-03-05 DIAGNOSIS — G4733 Obstructive sleep apnea (adult) (pediatric): Secondary | ICD-10-CM | POA: Diagnosis not present

## 2022-03-05 DIAGNOSIS — E1169 Type 2 diabetes mellitus with other specified complication: Secondary | ICD-10-CM | POA: Diagnosis not present

## 2022-03-05 DIAGNOSIS — E039 Hypothyroidism, unspecified: Secondary | ICD-10-CM | POA: Diagnosis not present

## 2022-03-05 DIAGNOSIS — H401131 Primary open-angle glaucoma, bilateral, mild stage: Secondary | ICD-10-CM | POA: Diagnosis not present

## 2022-03-11 DIAGNOSIS — E782 Mixed hyperlipidemia: Secondary | ICD-10-CM | POA: Diagnosis not present

## 2022-03-11 DIAGNOSIS — E039 Hypothyroidism, unspecified: Secondary | ICD-10-CM | POA: Diagnosis not present

## 2022-03-11 DIAGNOSIS — E1169 Type 2 diabetes mellitus with other specified complication: Secondary | ICD-10-CM | POA: Diagnosis not present

## 2022-03-11 DIAGNOSIS — H4089 Other specified glaucoma: Secondary | ICD-10-CM | POA: Diagnosis not present

## 2022-03-11 DIAGNOSIS — I10 Essential (primary) hypertension: Secondary | ICD-10-CM | POA: Diagnosis not present

## 2022-03-20 IMAGING — CT CT ABD-PELV W/ CM
1 of 3 series · 13 of 32 positions shown, 18 images · IV contrast (APPLIED)
Comparison: No priors.

CLINICAL DATA: 84-year-old male with history of right lower
quadrant abdominal pain. Elevated white blood cell count for 1 week.
Evaluate for diverticulitis or other etiology.

EXAM:
CT ABDOMEN AND PELVIS WITH CONTRAST
TECHNIQUE: Multidetector CT imaging of the abdomen and pelvis was performed
using the standard protocol following bolus administration of
intravenous contrast.
CONTRAST:  100mL [id number] IOPAMIDOL ([id number]) INJECTION 61%

[Series 2: abd/pelvis w/cm · axial · 0.77mm/px · z∈[-439,-64]mm · 13 of 87 slices shown, 18 images]
[im 6/87  soft-tissue]
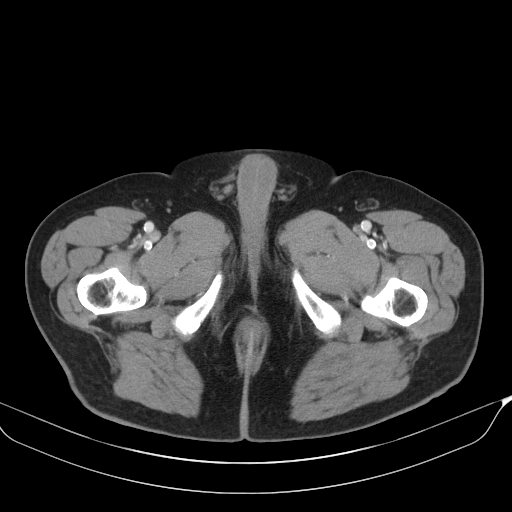
[im 6/87  bone]
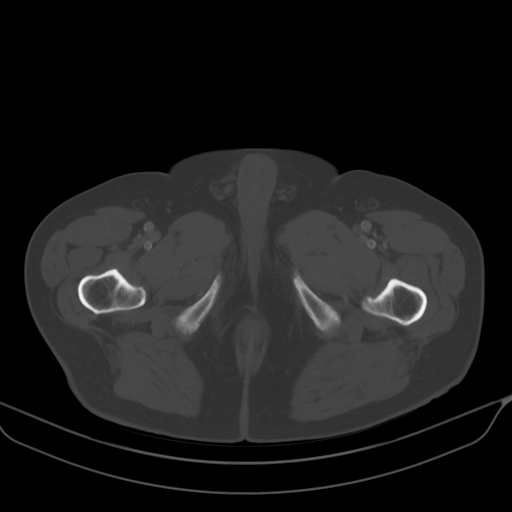
[im 12/87  soft-tissue]
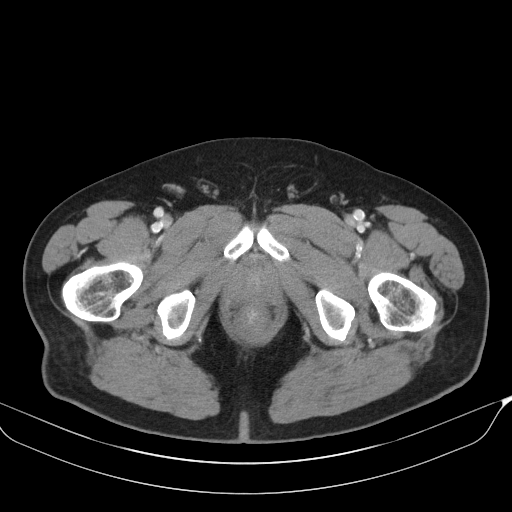
[im 18/87  soft-tissue]
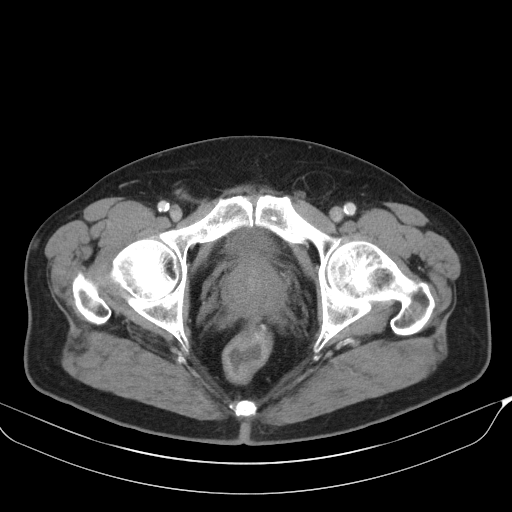
[im 29/87  soft-tissue]
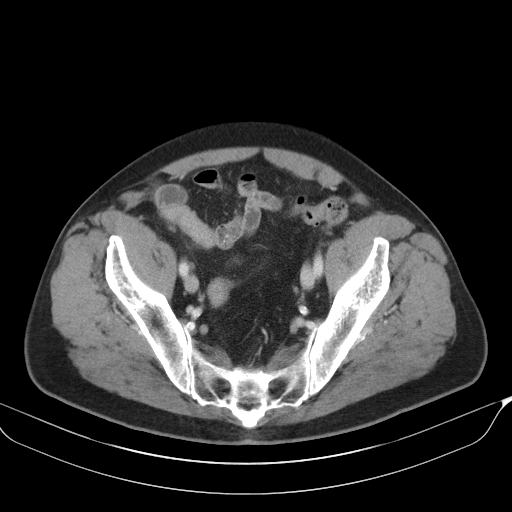
[im 35/87  soft-tissue]
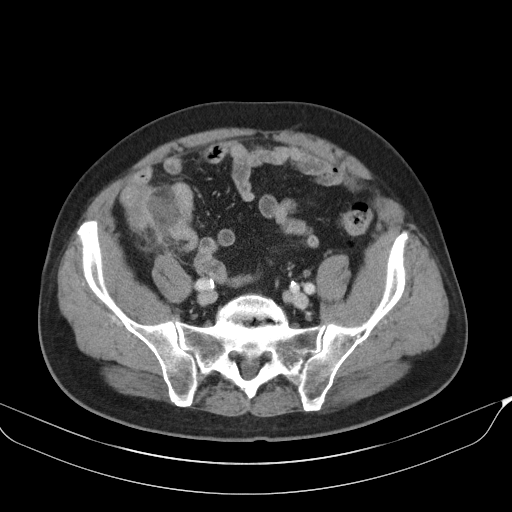
[im 41/87  soft-tissue]
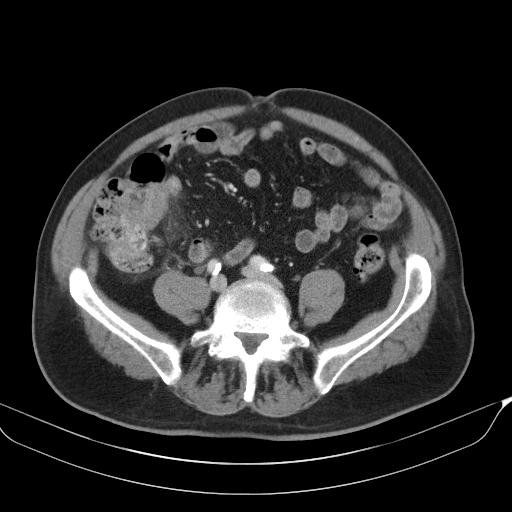
[im 46/87  soft-tissue]
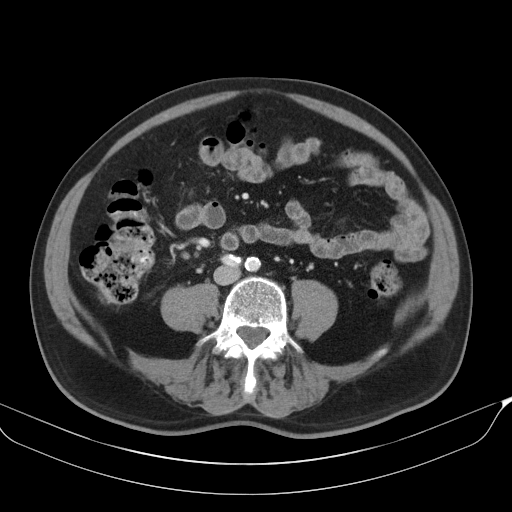
[im 52/87  soft-tissue]
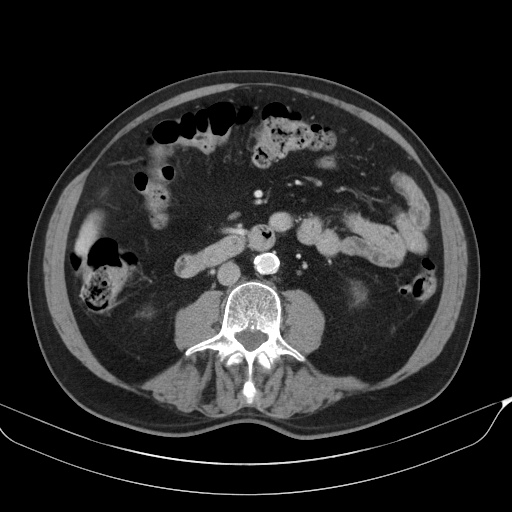
[im 58/87  soft-tissue]
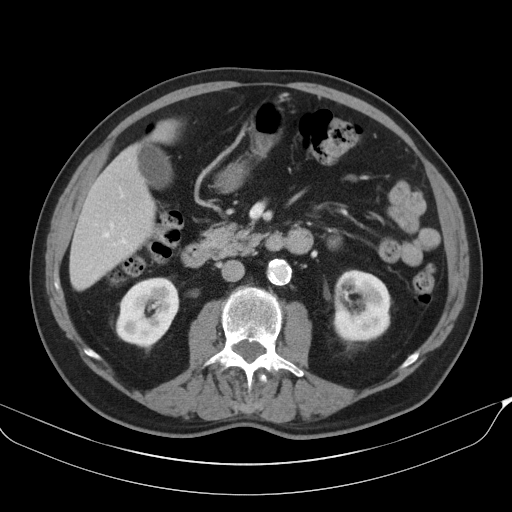
[im 58/87  bone]
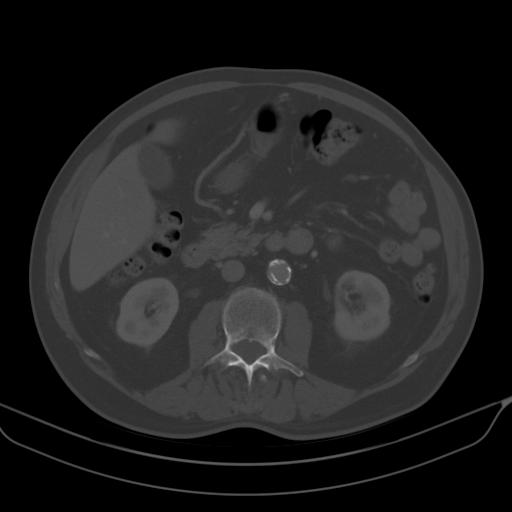
[im 64/87  lung]
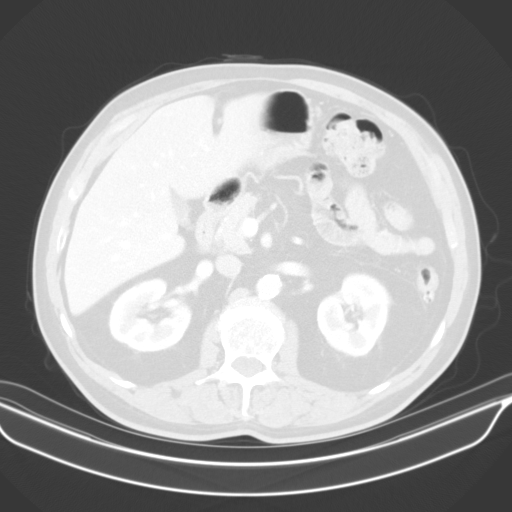
[im 69/87  soft-tissue]
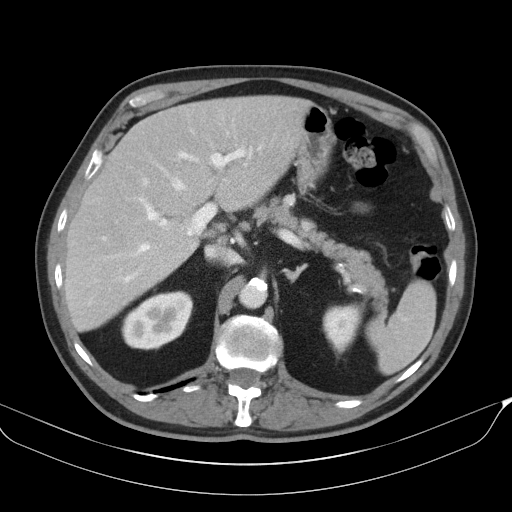
[im 69/87  lung]
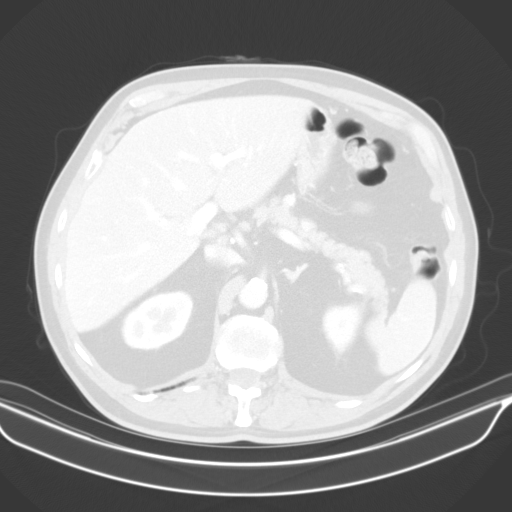
[im 75/87  soft-tissue]
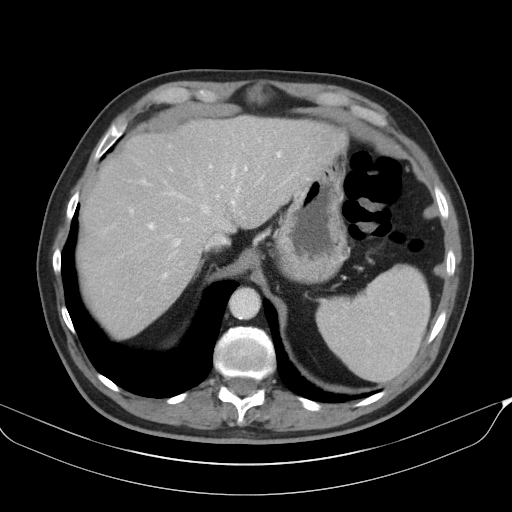
[im 75/87  lung]
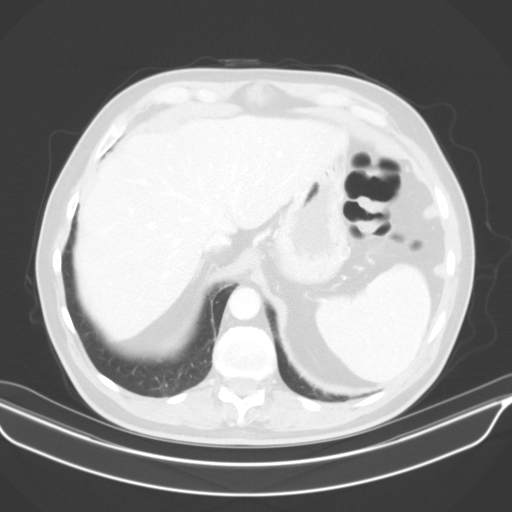
[im 81/87  soft-tissue]
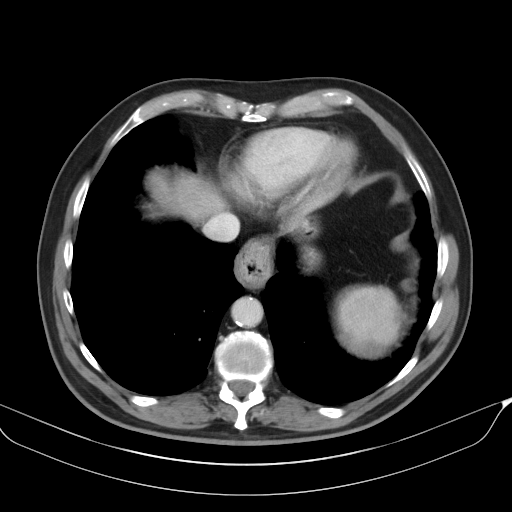
[im 81/87  lung]
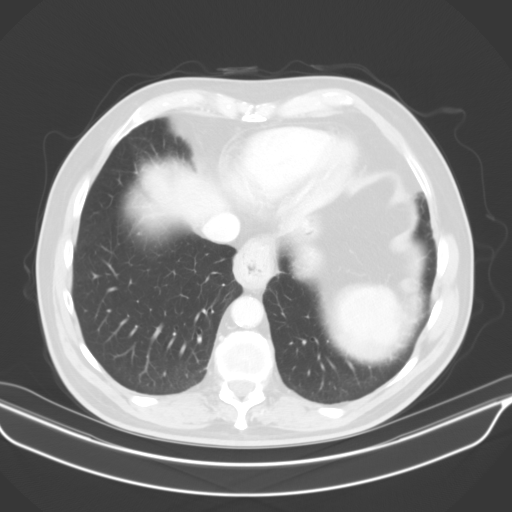

[13 of 32 positions shown; findings below may reference images not displayed]

FINDINGS: Lower chest: Scattered areas of scarring in the visualize lung
bases. Atherosclerotic calcifications in the descending thoracic
aorta as well as the right coronary artery. Small hiatal hernia.

Hepatobiliary: No suspicious cystic or solid hepatic lesions. No
intra or extrahepatic biliary ductal dilatation. Gallbladder is
normal in appearance.

Pancreas: No pancreatic mass. No pancreatic ductal dilatation. No
pancreatic or peripancreatic fluid collections or inflammatory
changes.

Spleen: Unremarkable.

Adrenals/Urinary Tract: Bilateral kidneys and adrenal glands are
normal in appearance. No hydroureteronephrosis. Urinary bladder is
normal in appearance.

Stomach/Bowel: Normal appearance of the stomach. No pathologic
dilatation of small bowel or colon.

Vascular/Lymphatic: In the right lower quadrant intimately
associated with the medial wall of the cecum in close proximity to
the orifice of the appendix there is a 3.8 x 2.4 x 4.3 cm
low-attenuation rim enhancing collection (axial image 51 of series 2
and coronal image 53 of series 3) with surrounding inflammatory
changes in the adjacent ileocolic fat. The wall of the cecum
adjacent to this is thickened. The appendix traverses adjacent to
this lesion, however, the remaining portions of the appendix are
otherwise normal in size and appearance. A few scattered colonic
diverticulae are noted, most evident in the descending colon and
proximal sigmoid colon, without definite surrounding inflammatory
changes.

Reproductive: Prostate gland and seminal vesicles are unremarkable
in appearance.

Other: No significant volume of ascites.  No pneumoperitoneum.

Musculoskeletal: There are no aggressive appearing lytic or blastic
lesions noted in the visualized portions of the skeleton.
IMPRESSION: 1. Large low-attenuation rim enhancing structure intimately
associated with the medial wall of the cecum with surrounding
inflammatory changes. This is closely associated with, but appears
separate from the appendix. Overall, this is concerning for
intramural abscess in the wall of the cecum, however, the
possibility of underlying neoplasm is difficult to entirely exclude.
Surgical consultation is strongly recommended in the immediate
future.
2. Colonic diverticulosis predominantly in the left side of the
colon without definitive findings to suggest an acute diverticulitis
at this time.
3. Aortic atherosclerosis.
4. Small hiatal hernia.

Critical Value/emergent results were called by telephone at the time
of interpretation on 02/23/2020 at [DATE] to provider HAJAK
DEIVIS MORKUNAS, who verbally acknowledged these results.

## 2022-03-26 DIAGNOSIS — Z961 Presence of intraocular lens: Secondary | ICD-10-CM | POA: Diagnosis not present

## 2022-03-26 DIAGNOSIS — H02834 Dermatochalasis of left upper eyelid: Secondary | ICD-10-CM | POA: Diagnosis not present

## 2022-03-26 DIAGNOSIS — H401131 Primary open-angle glaucoma, bilateral, mild stage: Secondary | ICD-10-CM | POA: Diagnosis not present

## 2022-03-26 DIAGNOSIS — D3132 Benign neoplasm of left choroid: Secondary | ICD-10-CM | POA: Diagnosis not present

## 2022-03-26 DIAGNOSIS — E119 Type 2 diabetes mellitus without complications: Secondary | ICD-10-CM | POA: Diagnosis not present

## 2022-03-26 DIAGNOSIS — H02831 Dermatochalasis of right upper eyelid: Secondary | ICD-10-CM | POA: Diagnosis not present

## 2022-03-28 DIAGNOSIS — M4692 Unspecified inflammatory spondylopathy, cervical region: Secondary | ICD-10-CM | POA: Diagnosis not present

## 2022-03-28 DIAGNOSIS — M542 Cervicalgia: Secondary | ICD-10-CM | POA: Diagnosis not present

## 2022-04-05 DIAGNOSIS — E782 Mixed hyperlipidemia: Secondary | ICD-10-CM | POA: Diagnosis not present

## 2022-04-05 DIAGNOSIS — I10 Essential (primary) hypertension: Secondary | ICD-10-CM | POA: Diagnosis not present

## 2022-04-05 DIAGNOSIS — H4089 Other specified glaucoma: Secondary | ICD-10-CM | POA: Diagnosis not present

## 2022-04-05 DIAGNOSIS — E039 Hypothyroidism, unspecified: Secondary | ICD-10-CM | POA: Diagnosis not present

## 2022-04-05 DIAGNOSIS — E1169 Type 2 diabetes mellitus with other specified complication: Secondary | ICD-10-CM | POA: Diagnosis not present

## 2022-04-05 DIAGNOSIS — M47812 Spondylosis without myelopathy or radiculopathy, cervical region: Secondary | ICD-10-CM | POA: Diagnosis not present

## 2022-04-16 DIAGNOSIS — M47812 Spondylosis without myelopathy or radiculopathy, cervical region: Secondary | ICD-10-CM | POA: Diagnosis not present

## 2022-04-26 DIAGNOSIS — D649 Anemia, unspecified: Secondary | ICD-10-CM | POA: Diagnosis not present

## 2022-05-16 DIAGNOSIS — D649 Anemia, unspecified: Secondary | ICD-10-CM | POA: Diagnosis not present

## 2022-05-20 DIAGNOSIS — M47812 Spondylosis without myelopathy or radiculopathy, cervical region: Secondary | ICD-10-CM | POA: Diagnosis not present

## 2022-05-28 DIAGNOSIS — M542 Cervicalgia: Secondary | ICD-10-CM | POA: Diagnosis not present

## 2022-06-04 DIAGNOSIS — M65351 Trigger finger, right little finger: Secondary | ICD-10-CM | POA: Diagnosis not present

## 2022-06-04 DIAGNOSIS — M65322 Trigger finger, left index finger: Secondary | ICD-10-CM | POA: Diagnosis not present

## 2022-06-05 DIAGNOSIS — M50323 Other cervical disc degeneration at C6-C7 level: Secondary | ICD-10-CM | POA: Diagnosis not present

## 2022-06-05 DIAGNOSIS — M5384 Other specified dorsopathies, thoracic region: Secondary | ICD-10-CM | POA: Diagnosis not present

## 2022-06-05 DIAGNOSIS — I10 Essential (primary) hypertension: Secondary | ICD-10-CM | POA: Diagnosis not present

## 2022-06-05 DIAGNOSIS — M9901 Segmental and somatic dysfunction of cervical region: Secondary | ICD-10-CM | POA: Diagnosis not present

## 2022-06-05 DIAGNOSIS — E039 Hypothyroidism, unspecified: Secondary | ICD-10-CM | POA: Diagnosis not present

## 2022-06-05 DIAGNOSIS — M9902 Segmental and somatic dysfunction of thoracic region: Secondary | ICD-10-CM | POA: Diagnosis not present

## 2022-06-05 DIAGNOSIS — E782 Mixed hyperlipidemia: Secondary | ICD-10-CM | POA: Diagnosis not present

## 2022-06-05 DIAGNOSIS — E1169 Type 2 diabetes mellitus with other specified complication: Secondary | ICD-10-CM | POA: Diagnosis not present

## 2022-06-06 DIAGNOSIS — M5384 Other specified dorsopathies, thoracic region: Secondary | ICD-10-CM | POA: Diagnosis not present

## 2022-06-06 DIAGNOSIS — M9901 Segmental and somatic dysfunction of cervical region: Secondary | ICD-10-CM | POA: Diagnosis not present

## 2022-06-06 DIAGNOSIS — M9902 Segmental and somatic dysfunction of thoracic region: Secondary | ICD-10-CM | POA: Diagnosis not present

## 2022-06-06 DIAGNOSIS — M50323 Other cervical disc degeneration at C6-C7 level: Secondary | ICD-10-CM | POA: Diagnosis not present

## 2022-06-10 DIAGNOSIS — M9902 Segmental and somatic dysfunction of thoracic region: Secondary | ICD-10-CM | POA: Diagnosis not present

## 2022-06-10 DIAGNOSIS — M5384 Other specified dorsopathies, thoracic region: Secondary | ICD-10-CM | POA: Diagnosis not present

## 2022-06-10 DIAGNOSIS — M9901 Segmental and somatic dysfunction of cervical region: Secondary | ICD-10-CM | POA: Diagnosis not present

## 2022-06-10 DIAGNOSIS — M50323 Other cervical disc degeneration at C6-C7 level: Secondary | ICD-10-CM | POA: Diagnosis not present

## 2022-06-11 DIAGNOSIS — M9902 Segmental and somatic dysfunction of thoracic region: Secondary | ICD-10-CM | POA: Diagnosis not present

## 2022-06-11 DIAGNOSIS — M50323 Other cervical disc degeneration at C6-C7 level: Secondary | ICD-10-CM | POA: Diagnosis not present

## 2022-06-11 DIAGNOSIS — M5384 Other specified dorsopathies, thoracic region: Secondary | ICD-10-CM | POA: Diagnosis not present

## 2022-06-11 DIAGNOSIS — M9901 Segmental and somatic dysfunction of cervical region: Secondary | ICD-10-CM | POA: Diagnosis not present

## 2022-06-13 DIAGNOSIS — M9901 Segmental and somatic dysfunction of cervical region: Secondary | ICD-10-CM | POA: Diagnosis not present

## 2022-06-13 DIAGNOSIS — M9902 Segmental and somatic dysfunction of thoracic region: Secondary | ICD-10-CM | POA: Diagnosis not present

## 2022-06-13 DIAGNOSIS — M50323 Other cervical disc degeneration at C6-C7 level: Secondary | ICD-10-CM | POA: Diagnosis not present

## 2022-06-13 DIAGNOSIS — M5384 Other specified dorsopathies, thoracic region: Secondary | ICD-10-CM | POA: Diagnosis not present

## 2022-06-18 DIAGNOSIS — M9902 Segmental and somatic dysfunction of thoracic region: Secondary | ICD-10-CM | POA: Diagnosis not present

## 2022-06-18 DIAGNOSIS — M5384 Other specified dorsopathies, thoracic region: Secondary | ICD-10-CM | POA: Diagnosis not present

## 2022-06-18 DIAGNOSIS — M50323 Other cervical disc degeneration at C6-C7 level: Secondary | ICD-10-CM | POA: Diagnosis not present

## 2022-06-18 DIAGNOSIS — M9901 Segmental and somatic dysfunction of cervical region: Secondary | ICD-10-CM | POA: Diagnosis not present

## 2022-06-19 DIAGNOSIS — M9902 Segmental and somatic dysfunction of thoracic region: Secondary | ICD-10-CM | POA: Diagnosis not present

## 2022-06-19 DIAGNOSIS — M5384 Other specified dorsopathies, thoracic region: Secondary | ICD-10-CM | POA: Diagnosis not present

## 2022-06-19 DIAGNOSIS — M9901 Segmental and somatic dysfunction of cervical region: Secondary | ICD-10-CM | POA: Diagnosis not present

## 2022-06-19 DIAGNOSIS — M50323 Other cervical disc degeneration at C6-C7 level: Secondary | ICD-10-CM | POA: Diagnosis not present

## 2022-06-20 DIAGNOSIS — M50323 Other cervical disc degeneration at C6-C7 level: Secondary | ICD-10-CM | POA: Diagnosis not present

## 2022-06-20 DIAGNOSIS — M9902 Segmental and somatic dysfunction of thoracic region: Secondary | ICD-10-CM | POA: Diagnosis not present

## 2022-06-20 DIAGNOSIS — M9901 Segmental and somatic dysfunction of cervical region: Secondary | ICD-10-CM | POA: Diagnosis not present

## 2022-06-20 DIAGNOSIS — M5384 Other specified dorsopathies, thoracic region: Secondary | ICD-10-CM | POA: Diagnosis not present

## 2022-06-24 DIAGNOSIS — M9901 Segmental and somatic dysfunction of cervical region: Secondary | ICD-10-CM | POA: Diagnosis not present

## 2022-06-24 DIAGNOSIS — M9902 Segmental and somatic dysfunction of thoracic region: Secondary | ICD-10-CM | POA: Diagnosis not present

## 2022-06-24 DIAGNOSIS — M5384 Other specified dorsopathies, thoracic region: Secondary | ICD-10-CM | POA: Diagnosis not present

## 2022-06-24 DIAGNOSIS — M50323 Other cervical disc degeneration at C6-C7 level: Secondary | ICD-10-CM | POA: Diagnosis not present

## 2022-06-25 DIAGNOSIS — M9901 Segmental and somatic dysfunction of cervical region: Secondary | ICD-10-CM | POA: Diagnosis not present

## 2022-06-25 DIAGNOSIS — M9902 Segmental and somatic dysfunction of thoracic region: Secondary | ICD-10-CM | POA: Diagnosis not present

## 2022-06-25 DIAGNOSIS — M5384 Other specified dorsopathies, thoracic region: Secondary | ICD-10-CM | POA: Diagnosis not present

## 2022-06-25 DIAGNOSIS — M50323 Other cervical disc degeneration at C6-C7 level: Secondary | ICD-10-CM | POA: Diagnosis not present

## 2022-06-27 DIAGNOSIS — G4733 Obstructive sleep apnea (adult) (pediatric): Secondary | ICD-10-CM | POA: Diagnosis not present

## 2022-06-27 DIAGNOSIS — M5384 Other specified dorsopathies, thoracic region: Secondary | ICD-10-CM | POA: Diagnosis not present

## 2022-06-27 DIAGNOSIS — M50323 Other cervical disc degeneration at C6-C7 level: Secondary | ICD-10-CM | POA: Diagnosis not present

## 2022-06-27 DIAGNOSIS — M9902 Segmental and somatic dysfunction of thoracic region: Secondary | ICD-10-CM | POA: Diagnosis not present

## 2022-06-27 DIAGNOSIS — M9901 Segmental and somatic dysfunction of cervical region: Secondary | ICD-10-CM | POA: Diagnosis not present

## 2022-07-02 DIAGNOSIS — M9901 Segmental and somatic dysfunction of cervical region: Secondary | ICD-10-CM | POA: Diagnosis not present

## 2022-07-02 DIAGNOSIS — M5384 Other specified dorsopathies, thoracic region: Secondary | ICD-10-CM | POA: Diagnosis not present

## 2022-07-02 DIAGNOSIS — M9902 Segmental and somatic dysfunction of thoracic region: Secondary | ICD-10-CM | POA: Diagnosis not present

## 2022-07-02 DIAGNOSIS — M50323 Other cervical disc degeneration at C6-C7 level: Secondary | ICD-10-CM | POA: Diagnosis not present

## 2022-07-04 DIAGNOSIS — E039 Hypothyroidism, unspecified: Secondary | ICD-10-CM | POA: Diagnosis not present

## 2022-07-04 DIAGNOSIS — E782 Mixed hyperlipidemia: Secondary | ICD-10-CM | POA: Diagnosis not present

## 2022-07-04 DIAGNOSIS — E1169 Type 2 diabetes mellitus with other specified complication: Secondary | ICD-10-CM | POA: Diagnosis not present

## 2022-07-04 DIAGNOSIS — H4089 Other specified glaucoma: Secondary | ICD-10-CM | POA: Diagnosis not present

## 2022-07-04 DIAGNOSIS — M9901 Segmental and somatic dysfunction of cervical region: Secondary | ICD-10-CM | POA: Diagnosis not present

## 2022-07-04 DIAGNOSIS — M9902 Segmental and somatic dysfunction of thoracic region: Secondary | ICD-10-CM | POA: Diagnosis not present

## 2022-07-04 DIAGNOSIS — M50323 Other cervical disc degeneration at C6-C7 level: Secondary | ICD-10-CM | POA: Diagnosis not present

## 2022-07-04 DIAGNOSIS — I10 Essential (primary) hypertension: Secondary | ICD-10-CM | POA: Diagnosis not present

## 2022-07-04 DIAGNOSIS — M5384 Other specified dorsopathies, thoracic region: Secondary | ICD-10-CM | POA: Diagnosis not present

## 2022-07-09 DIAGNOSIS — M9901 Segmental and somatic dysfunction of cervical region: Secondary | ICD-10-CM | POA: Diagnosis not present

## 2022-07-09 DIAGNOSIS — M9902 Segmental and somatic dysfunction of thoracic region: Secondary | ICD-10-CM | POA: Diagnosis not present

## 2022-07-09 DIAGNOSIS — M50323 Other cervical disc degeneration at C6-C7 level: Secondary | ICD-10-CM | POA: Diagnosis not present

## 2022-07-09 DIAGNOSIS — M5384 Other specified dorsopathies, thoracic region: Secondary | ICD-10-CM | POA: Diagnosis not present

## 2022-07-10 DIAGNOSIS — M542 Cervicalgia: Secondary | ICD-10-CM | POA: Diagnosis not present

## 2022-07-11 DIAGNOSIS — M50323 Other cervical disc degeneration at C6-C7 level: Secondary | ICD-10-CM | POA: Diagnosis not present

## 2022-07-11 DIAGNOSIS — M9901 Segmental and somatic dysfunction of cervical region: Secondary | ICD-10-CM | POA: Diagnosis not present

## 2022-07-11 DIAGNOSIS — M5384 Other specified dorsopathies, thoracic region: Secondary | ICD-10-CM | POA: Diagnosis not present

## 2022-07-11 DIAGNOSIS — M9902 Segmental and somatic dysfunction of thoracic region: Secondary | ICD-10-CM | POA: Diagnosis not present

## 2022-07-16 DIAGNOSIS — M50323 Other cervical disc degeneration at C6-C7 level: Secondary | ICD-10-CM | POA: Diagnosis not present

## 2022-07-16 DIAGNOSIS — M9901 Segmental and somatic dysfunction of cervical region: Secondary | ICD-10-CM | POA: Diagnosis not present

## 2022-07-16 DIAGNOSIS — M5384 Other specified dorsopathies, thoracic region: Secondary | ICD-10-CM | POA: Diagnosis not present

## 2022-07-16 DIAGNOSIS — M9902 Segmental and somatic dysfunction of thoracic region: Secondary | ICD-10-CM | POA: Diagnosis not present

## 2022-07-23 DIAGNOSIS — M9901 Segmental and somatic dysfunction of cervical region: Secondary | ICD-10-CM | POA: Diagnosis not present

## 2022-07-23 DIAGNOSIS — M50323 Other cervical disc degeneration at C6-C7 level: Secondary | ICD-10-CM | POA: Diagnosis not present

## 2022-07-23 DIAGNOSIS — M9902 Segmental and somatic dysfunction of thoracic region: Secondary | ICD-10-CM | POA: Diagnosis not present

## 2022-07-23 DIAGNOSIS — M5384 Other specified dorsopathies, thoracic region: Secondary | ICD-10-CM | POA: Diagnosis not present

## 2022-07-29 DIAGNOSIS — M4722 Other spondylosis with radiculopathy, cervical region: Secondary | ICD-10-CM | POA: Diagnosis not present

## 2022-07-30 DIAGNOSIS — M50323 Other cervical disc degeneration at C6-C7 level: Secondary | ICD-10-CM | POA: Diagnosis not present

## 2022-07-30 DIAGNOSIS — M5384 Other specified dorsopathies, thoracic region: Secondary | ICD-10-CM | POA: Diagnosis not present

## 2022-07-30 DIAGNOSIS — M9901 Segmental and somatic dysfunction of cervical region: Secondary | ICD-10-CM | POA: Diagnosis not present

## 2022-07-30 DIAGNOSIS — M9902 Segmental and somatic dysfunction of thoracic region: Secondary | ICD-10-CM | POA: Diagnosis not present

## 2022-08-01 DIAGNOSIS — M50323 Other cervical disc degeneration at C6-C7 level: Secondary | ICD-10-CM | POA: Diagnosis not present

## 2022-08-01 DIAGNOSIS — M9902 Segmental and somatic dysfunction of thoracic region: Secondary | ICD-10-CM | POA: Diagnosis not present

## 2022-08-01 DIAGNOSIS — M9901 Segmental and somatic dysfunction of cervical region: Secondary | ICD-10-CM | POA: Diagnosis not present

## 2022-08-01 DIAGNOSIS — M5384 Other specified dorsopathies, thoracic region: Secondary | ICD-10-CM | POA: Diagnosis not present

## 2022-08-13 DIAGNOSIS — M5412 Radiculopathy, cervical region: Secondary | ICD-10-CM | POA: Diagnosis not present

## 2022-08-13 DIAGNOSIS — M5030 Other cervical disc degeneration, unspecified cervical region: Secondary | ICD-10-CM | POA: Diagnosis not present

## 2022-08-13 DIAGNOSIS — M542 Cervicalgia: Secondary | ICD-10-CM | POA: Diagnosis not present

## 2022-09-02 DIAGNOSIS — M5412 Radiculopathy, cervical region: Secondary | ICD-10-CM | POA: Diagnosis not present

## 2022-12-03 DIAGNOSIS — M65342 Trigger finger, left ring finger: Secondary | ICD-10-CM | POA: Diagnosis not present

## 2022-12-03 DIAGNOSIS — M65351 Trigger finger, right little finger: Secondary | ICD-10-CM | POA: Diagnosis not present

## 2022-12-06 DIAGNOSIS — R053 Chronic cough: Secondary | ICD-10-CM | POA: Diagnosis not present

## 2022-12-06 DIAGNOSIS — J069 Acute upper respiratory infection, unspecified: Secondary | ICD-10-CM | POA: Diagnosis not present

## 2022-12-06 DIAGNOSIS — R059 Cough, unspecified: Secondary | ICD-10-CM | POA: Diagnosis not present

## 2023-02-07 ENCOUNTER — Encounter: Payer: Self-pay | Admitting: Cardiology

## 2023-02-07 ENCOUNTER — Ambulatory Visit: Payer: Medicare Other | Attending: Cardiology | Admitting: Cardiology

## 2023-02-07 VITALS — BP 100/60 | HR 58 | Ht 68.5 in | Wt 173.0 lb

## 2023-02-07 DIAGNOSIS — G4733 Obstructive sleep apnea (adult) (pediatric): Secondary | ICD-10-CM

## 2023-02-07 DIAGNOSIS — I10 Essential (primary) hypertension: Secondary | ICD-10-CM | POA: Diagnosis not present

## 2023-02-07 DIAGNOSIS — I452 Bifascicular block: Secondary | ICD-10-CM

## 2023-02-07 LAB — BASIC METABOLIC PANEL
BUN/Creatinine Ratio: 21 (ref 10–24)
BUN: 25 mg/dL (ref 8–27)
CO2: 26 mmol/L (ref 20–29)
Calcium: 9.8 mg/dL (ref 8.6–10.2)
Chloride: 101 mmol/L (ref 96–106)
Creatinine, Ser: 1.2 mg/dL (ref 0.76–1.27)
Glucose: 95 mg/dL (ref 70–99)
Potassium: 4.2 mmol/L (ref 3.5–5.2)
Sodium: 141 mmol/L (ref 134–144)
eGFR: 59 mL/min/{1.73_m2} — ABNORMAL LOW (ref >59)

## 2023-02-07 NOTE — Progress Notes (Addendum)
Date:  02/07/2023   ID:  [name], DOB [birth year], MRN [id number]   PCP:  Sigmund Hazel, MD  Cardiologist:  Armanda Magic, MD  Electrophysiologist:  None   Chief Complaint:  OSA, HT  History of Present Illness:    Kevin [name] is a 88 y.o. male with thrombocytopenia, DM, HTN, GERD, PAD and chronic venous insufficiency (managed conservatively by Dr. Randie Heinz), sinus bradycardia, trifascicular block with chronic RBBB, former tobacco use. He also has mild OSA with  an AHI of 9.6/hr and O2 sats as low as 81%. When I saw him last we decided to treat his OSA with CPAP and he is now back for followup after getting on PAP therapy.    He is here today for followup and is doing well.  his denies any chest pain or pressure, SOB, DOE, PND, orthopnea,  dizziness, palpitations or syncope. He has some mild LE edema at times.  He is compliant with He meds and is tolerating meds with no SE.    He is doing well with his PAP device and thinks that he has gotten used to it.  He tolerates the mask and feels the pressure is adequate.  Since going on PAP he feels rested in the am and has no significant daytime sleepiness.  He denies any significant mouth or nasal dryness or nasal congestion.  He does not think that he snores.    Prior CV studies:   The following studies were reviewed today:  PAP compliance download  EKG Interpretation Date/Time:  Friday February 07 2023 10:21:48 EST Ventricular Rate:  58 PR Interval:  224 QRS Duration:  140 QT Interval:  456 QTC Calculation: 447 R Axis:   -57  Text Interpretation: Sinus bradycardia with sinus arrhythmia with 1st degree A-V block Right bundle branch block Left anterior fascicular block Bifascicular block Minimal voltage criteria for LVH, may be normal variant ( R in aVL ) Septal infarct , age undetermined When compared with ECG of 05-Nov-2018 05:38, T wave inversion no longer evident in Inferior leads Confirmed by Armanda Magic [phone]) on 02/07/2023  10:38:17 AM    Past Medical History:  Diagnosis Date   Blood dyscrasia    chronic thrombocytopenia   Chronic kidney disease    Chronic venous insufficiency    Diabetes mellitus type 2 in nonobese (HCC)    GERD (gastroesophageal reflux disease)    HLD (hyperlipidemia) 11/05/2018   Hypertension    OSA on CPAP    PAD (peripheral artery disease) (HCC)    Pre-syncope 11/05/2018   Symptomatic bradycardia 11/03/2018   Past Surgical History:  Procedure Laterality Date   CARPAL TUNNEL RELEASE Left 04/28/2015   Procedure: LEFT WRIST CARPAL TUNNEL RELEASE;  Surgeon: Marcene Corning, MD;  Location: [facility];  Service: Orthopedics;  Laterality: Left;   COLONOSCOPY WITH PROPOFOL N/A 08/01/2020   Procedure: COLONOSCOPY WITH PROPOFOL;  Surgeon: Charlott Rakes, MD;  Location: [facility];  Service: Endoscopy;  Laterality: N/A;   HOT HEMOSTASIS N/A 08/01/2020   Procedure: HOT HEMOSTASIS (ARGON PLASMA COAGULATION/BICAP);  Surgeon: Charlott Rakes, MD;  Location: [facility];  Service: Endoscopy;  Laterality: N/A;   POLYPECTOMY  08/01/2020   Procedure: POLYPECTOMY;  Surgeon: Charlott Rakes, MD;  Location: [facility];  Service: Endoscopy;;   SEPTOPLASTY       Current Meds  Medication Sig   acetaminophen (TYLENOL) 325 MG tablet Take 2 tablets (650 mg total) by mouth every 6 (six) hours as needed for mild pain.  ascorbic acid (VITAMIN C) 500 MG tablet Take 500 mg by mouth daily.   aspirin 81 MG tablet Take 81 mg by mouth daily.   atorvastatin (LIPITOR) 20 MG tablet Take 20 mg by mouth at bedtime.   docusate sodium (COLACE) 100 MG capsule Take 100 mg by mouth in the morning and at bedtime.   famotidine (PEPCID) 20 MG tablet Take 20 mg by mouth 2 (two) times daily.   hydrochlorothiazide (HYDRODIURIL) 25 MG tablet Take 25 mg by mouth daily.   latanoprost (XALATAN) 0.005 % ophthalmic solution Place 1 drop into both eyes at bedtime.   levothyroxine (SYNTHROID) 50 MCG tablet Take  50 mcg by mouth daily before breakfast.   lisinopril (ZESTRIL) 40 MG tablet Take 40 mg by mouth daily.   metFORMIN (GLUCOPHAGE) 500 MG tablet Take 500 mg by mouth daily with breakfast.    Multiple Vitamin (MULTIVITAMIN) capsule Take 1 capsule by mouth daily.   Propylene Glycol (SYSTANE BALANCE) 0.6 % SOLN Place 1 drop into both eyes daily as needed (dry eyes).   vitamin E 200 UNIT capsule Take 200 Units by mouth daily.     Allergies:   Amlodipine and Metoprolol succinate [metoprolol]   Social History   Tobacco Use   Smoking status: Former    Types: Cigarettes   Smokeless tobacco: Former  Substance Use Topics   Alcohol use: No   Drug use: No     Family Hx: The patient's family history includes Heart attack (age of onset: 87) in his mother; Stroke (age of onset: 9) in his father.  ROS:   Please see the history of present illness.     All other systems reviewed and are negative.   Labs/Other Tests and Data Reviewed:    Recent Labs: No results found for requested labs within last 365 days.   Recent Lipid Panel No results found for: "CHOL", "TRIG", "HDL", "CHOLHDL", "LDLCALC", "LDLDIRECT"  Wt Readings from Last 3 Encounters:  02/07/23 173 lb (78.5 kg)  01/09/22 172 lb 12.8 oz (78.4 kg)  08/11/20 175 lb (79.4 kg)     Objective:    Vital Signs:  BP 100/60   Pulse (!) 58   Ht 5' 8.5" (1.74 m)   Wt 173 lb (78.5 kg)   BMI 25.92 kg/m   GEN: Well nourished, well developed in no acute distress HEENT: Normal NECK: No JVD; No carotid bruits LYMPHATICS: No lymphadenopathy CARDIAC:RRR, no murmurs, rubs, gallops RESPIRATORY:  Clear to auscultation without rales, wheezing or rhonchi  ABDOMEN: Soft, non-tender, non-distended MUSCULOSKELETAL:  trace edema; No deformity  SKIN: Warm and dry NEUROLOGIC:  Alert and oriented x 3 PSYCHIATRIC:  Normal affect  ASSESSMENT & PLAN:    OSA - The patient is tolerating PAP therapy well without any problems. The PAP download performed  by his DME was personally reviewed and interpreted by me today and showed an AHI of 1.8 /hr on auto CPAP from 4-18 cm H2O with 73 % compliance in using more than 4 hours nightly.  The patient has been using and benefiting from PAP use and will continue to benefit from therapy.    HTN -BP is controlled on exam today -Continue drug management with HCTZ 25 mg daily and lisinopril 40 mg daily with as needed refills -Check BMP   RBBB with LAFB -This is chronic and unchanged   Medication Adjustments/Labs and Tests Ordered: Current medicines are reviewed at length with the patient today.  Concerns regarding medicines are outlined above.  Tests  Ordered: Orders Placed This Encounter  Procedures   EKG 12-Lead    Medication Changes: No orders of the defined types were placed in this encounter.    Disposition:  Follow up 1 year  Signed, Armanda Magic, MD  02/07/2023 10:38 AM    [facility]

## 2023-02-07 NOTE — Addendum Note (Signed)
Addended by: Franchot Gallo on: 02/07/2023 10:45 AM   Modules accepted: Orders

## 2023-02-07 NOTE — Patient Instructions (Signed)
Medication Instructions:  Your physician recommends that you continue on your current medications as directed. Please refer to the Current Medication list given to you today.  *If you need a refill on your cardiac medications before your next appointment, please call your pharmacy*  Lab Work: TODAY: BMET If you have labs (blood work) drawn today and your tests are completely normal, you will receive your results only by: MyChart Message (if you have MyChart) OR A paper copy in the mail If you have any lab test that is abnormal or we need to change your treatment, we will call you to review the results.  Testing/Procedures: None ordered today.  Follow-Up: At [facility], you and your health needs are our priority.  As part of our continuing mission to provide you with exceptional heart care, we have created designated Provider Care Teams.  These Care Teams include your primary Cardiologist (physician) and Advanced Practice Providers (APPs -  Physician Assistants and Nurse Practitioners) who all work together to provide you with the care you need, when you need it.  We recommend signing up for the patient portal called "MyChart".  Sign up information is provided on this After Visit Summary.  MyChart is used to connect with patients for Virtual Visits (Telemedicine).  Patients are able to view lab/test results, encounter notes, upcoming appointments, etc.  Non-urgent messages can be sent to your provider as well.   To learn more about what you can do with MyChart, go to [email or web address].    Your next appointment:   1 year(s)  The format for your next appointment:   In Person  Provider:   Armanda Magic, MD {

## 2023-02-13 ENCOUNTER — Telehealth: Payer: Self-pay

## 2023-02-13 DIAGNOSIS — J0100 Acute maxillary sinusitis, unspecified: Secondary | ICD-10-CM | POA: Diagnosis not present

## 2023-02-13 NOTE — Telephone Encounter (Signed)
-----   Message from Nurse Kinnie Feil C sent at 02/11/2023  7:52 AM EST -----  ----- Message ----- From: Quintella Reichert, MD Sent: 02/08/2023   3:18 PM EST To: Frutoso Schatz, RN  Stable labs - continue current meds and forward to PCP

## 2023-02-13 NOTE — Telephone Encounter (Signed)
Call to patient to discuss stable labs, no answer. Left detailed message per DPR explaining labs normal, continue current meds. Forwarded copy to patient's PCP.

## 2023-03-07 DIAGNOSIS — H401131 Primary open-angle glaucoma, bilateral, mild stage: Secondary | ICD-10-CM | POA: Diagnosis not present

## 2023-03-07 DIAGNOSIS — E1151 Type 2 diabetes mellitus with diabetic peripheral angiopathy without gangrene: Secondary | ICD-10-CM | POA: Diagnosis not present

## 2023-03-07 DIAGNOSIS — I10 Essential (primary) hypertension: Secondary | ICD-10-CM | POA: Diagnosis not present

## 2023-03-07 DIAGNOSIS — D6869 Other thrombophilia: Secondary | ICD-10-CM | POA: Diagnosis not present

## 2023-03-07 DIAGNOSIS — D649 Anemia, unspecified: Secondary | ICD-10-CM | POA: Diagnosis not present

## 2023-03-07 DIAGNOSIS — G4733 Obstructive sleep apnea (adult) (pediatric): Secondary | ICD-10-CM | POA: Diagnosis not present

## 2023-03-07 DIAGNOSIS — I739 Peripheral vascular disease, unspecified: Secondary | ICD-10-CM | POA: Diagnosis not present

## 2023-03-07 DIAGNOSIS — E039 Hypothyroidism, unspecified: Secondary | ICD-10-CM | POA: Diagnosis not present

## 2023-03-21 ENCOUNTER — Ambulatory Visit: Payer: Medicare Other | Admitting: Podiatry

## 2023-03-21 DIAGNOSIS — E119 Type 2 diabetes mellitus without complications: Secondary | ICD-10-CM | POA: Diagnosis not present

## 2023-03-21 NOTE — Progress Notes (Signed)
 Subjective: Kevin [name] presents today referred by Jarrett Soho, PA-C for diabetic foot evaluation.  Patient relates many year history of diabetes.  Patient denies any history of foot wounds.  Patient some any history of numbness, tingling, burning, pins/needles sensations.  Past Medical History:  Diagnosis Date   Blood dyscrasia    chronic thrombocytopenia   Chronic kidney disease    Chronic venous insufficiency    Diabetes mellitus type 2 in nonobese (HCC)    GERD (gastroesophageal reflux disease)    HLD (hyperlipidemia) 11/05/2018   Hypertension    OSA on CPAP    PAD (peripheral artery disease) (HCC)    Pre-syncope 11/05/2018   Symptomatic bradycardia 11/03/2018    Patient Active Problem List   Diagnosis Date Noted   Hx of adenomatous colonic polyps 08/01/2020   Abscess of abdominal cavity (HCC) 02/23/2020   Pre-syncope 11/05/2018   HLD (hyperlipidemia) 11/05/2018   Symptomatic bradycardia 11/03/2018   Hypertension    Diabetes mellitus without complication [facility])     Past Surgical History:  Procedure Laterality Date   CARPAL TUNNEL RELEASE Left 04/28/2015   Procedure: LEFT WRIST CARPAL TUNNEL RELEASE;  Surgeon: Marcene Corning, MD;  Location: [facility];  Service: Orthopedics;  Laterality: Left;   COLONOSCOPY WITH PROPOFOL N/A 08/01/2020   Procedure: COLONOSCOPY WITH PROPOFOL;  Surgeon: Charlott Rakes, MD;  Location: [facility];  Service: Endoscopy;  Laterality: N/A;   HOT HEMOSTASIS N/A 08/01/2020   Procedure: HOT HEMOSTASIS (ARGON PLASMA COAGULATION/BICAP);  Surgeon: Charlott Rakes, MD;  Location: [facility];  Service: Endoscopy;  Laterality: N/A;   POLYPECTOMY  08/01/2020   Procedure: POLYPECTOMY;  Surgeon: Charlott Rakes, MD;  Location: [facility];  Service: Endoscopy;;   SEPTOPLASTY      Current Outpatient Medications on File Prior to Visit  Medication Sig Dispense Refill   acetaminophen (TYLENOL) 325 MG tablet Take 2 tablets  (650 mg total) by mouth every 6 (six) hours as needed for mild pain.     ascorbic acid (VITAMIN C) 500 MG tablet Take 500 mg by mouth daily.     aspirin 81 MG tablet Take 81 mg by mouth daily.     atorvastatin (LIPITOR) 20 MG tablet Take 20 mg by mouth at bedtime.     docusate sodium (COLACE) 100 MG capsule Take 100 mg by mouth in the morning and at bedtime.     famotidine (PEPCID) 20 MG tablet Take 20 mg by mouth 2 (two) times daily.     hydrochlorothiazide (HYDRODIURIL) 25 MG tablet Take 25 mg by mouth daily.     latanoprost (XALATAN) 0.005 % ophthalmic solution Place 1 drop into both eyes at bedtime.     levothyroxine (SYNTHROID) 50 MCG tablet Take 50 mcg by mouth daily before breakfast.     lisinopril (ZESTRIL) 40 MG tablet Take 40 mg by mouth daily.     metFORMIN (GLUCOPHAGE) 500 MG tablet Take 500 mg by mouth daily with breakfast.      methocarbamol (ROBAXIN) 500 MG tablet Take 500-1,000 mg by mouth 3 (three) times daily as needed. (Patient not taking: Reported on 02/07/2023)     Multiple Vitamin (MULTIVITAMIN) capsule Take 1 capsule by mouth daily.     Propylene Glycol (SYSTANE BALANCE) 0.6 % SOLN Place 1 drop into both eyes daily as needed (dry eyes).     vitamin E 200 UNIT capsule Take 200 Units by mouth daily.     No current facility-administered medications on file prior to visit.  Allergies  Allergen Reactions   Amlodipine Swelling   Metoprolol Succinate [Metoprolol] Other (See Comments)    Heart slows down    Social History   Occupational History   Occupation: Tool and Scientist, forensic, Chartered certified accountant  Tobacco Use   Smoking status: Former    Types: Cigarettes   Smokeless tobacco: Former  Substance and Sexual Activity   Alcohol use: No   Drug use: No   Sexual activity: Not on file    Family History  Problem Relation Age of Onset   Heart attack Mother 73   Stroke Father 18    Immunization History  Administered Date(s) Administered   PFIZER(Purple Top)SARS-COV-2  Vaccination 02/12/2019, 03/05/2019    Review of systems: Positive Findings in bold print.  Constitutional:  chills, fatigue, fever, sweats, weight change Communication: Nurse, learning disability, sign Presenter, broadcasting, hand writing, iPad/Android device Head: headaches, head injury Eyes: changes in vision, eye pain, glaucoma, cataracts, macular degeneration, diplopia, glare,  light sensitivity, eyeglasses or contacts, blindness Ears nose mouth throat: hearing impaired, hearing aids,  ringing in ears, deaf, sign language,  vertigo, nosebleeds,  rhinitis,  cold sores, snoring, swollen glands Cardiovascular: HTN, edema, arrhythmia, pacemaker in place, defibrillator in place, chest pain/tightness, chronic anticoagulation, blood clot, heart failure, MI Peripheral Vascular: leg cramps, varicose veins, blood clots, lymphedema, varicosities Respiratory:  asthma, difficulty breathing, denies congestion, SOB, wheezing, cough, emphysema Gastrointestinal: change in appetite or weight, abdominal pain, constipation, diarrhea, nausea, vomiting, vomiting blood, change in bowel habits, abdominal pain, jaundice, rectal bleeding, hemorrhoids, GERD Genitourinary:  nocturia,  pain on urination, polyuria,  blood in urine, Foley catheter, urinary urgency, ESRD on hemodialysis Musculoskeletal: amputation, cramping, stiff joints, painful joints, decreased joint motion, fractures, OA, gout, hemiplegia, paraplegia, uses cane, wheelchair bound, uses walker, uses rollator Skin: +changes in toenails, color change, dryness, itching, mole changes,  rash, wound(s) Neurological: headaches, numbness in feet, paresthesias in feet, burning in feet, fainting,  seizures, change in speech, migraines, memory problems/poor historian, cerebral palsy, weakness, paralysis, CVA, TIA Endocrine: diabetes, hypothyroidism, hyperthyroidism,  goiter, dry mouth, flushing, heat intolerance, cold intolerance,  excessive thirst, denies polyuria,   nocturia Hematological:  easy bleeding, excessive bleeding, easy bruising, enlarged lymph nodes, on long term blood thinner, history of past transusions Allergy/immunological:  hives, eczema, frequent infections, multiple drug allergies, seasonal allergies, transplant recipient, multiple food allergies Psychiatric:  anxiety, depression, mood disorder, suicidal ideations, hallucinations, insomnia  Objective: There were no vitals filed for this visit. Vascular Examination: Capillary refill time less than 3 seconds x 10 digits.  Dorsalis pedis pulses palpable 1 out of 4.  Posterior tibial pulses palpable 1 out of 4.  Digital hair present x 10 digits.  Skin temperature gradient WNL b/l.  Dermatological Examination: Skin with normal turgor, texture and tone b/l  Toenails 1-5 b/l discolored, thick, dystrophic with subungual debris and pain with palpation to nailbeds due to thickness of nails.  Musculoskeletal: Muscle strength 5/5 to all LE muscle groups.  Neurological: Sensation intact with 10 gram monofilament.  Vibratory sensation intact.  Assessment: NIDDM Encounter for diabetic foot examination  Plan: Discussed diabetic foot care principles. Literature dispensed on today. Patient to continue soft, supportive shoe gear daily. Patient to report any pedal injuries to medical professional immediately. Follow up one year. Patient/POA to call should there be a concern in the interim.

## 2023-03-28 DIAGNOSIS — H02834 Dermatochalasis of left upper eyelid: Secondary | ICD-10-CM | POA: Diagnosis not present

## 2023-03-28 DIAGNOSIS — Z961 Presence of intraocular lens: Secondary | ICD-10-CM | POA: Diagnosis not present

## 2023-03-28 DIAGNOSIS — H02831 Dermatochalasis of right upper eyelid: Secondary | ICD-10-CM | POA: Diagnosis not present

## 2023-03-28 DIAGNOSIS — H401131 Primary open-angle glaucoma, bilateral, mild stage: Secondary | ICD-10-CM | POA: Diagnosis not present

## 2023-03-28 DIAGNOSIS — E119 Type 2 diabetes mellitus without complications: Secondary | ICD-10-CM | POA: Diagnosis not present

## 2023-03-28 DIAGNOSIS — D3132 Benign neoplasm of left choroid: Secondary | ICD-10-CM | POA: Diagnosis not present

## 2023-05-20 DIAGNOSIS — M65351 Trigger finger, right little finger: Secondary | ICD-10-CM | POA: Diagnosis not present

## 2023-05-20 DIAGNOSIS — M65342 Trigger finger, left ring finger: Secondary | ICD-10-CM | POA: Diagnosis not present

## 2023-06-18 DIAGNOSIS — S9002XA Contusion of left ankle, initial encounter: Secondary | ICD-10-CM | POA: Diagnosis not present

## 2023-06-23 DIAGNOSIS — R251 Tremor, unspecified: Secondary | ICD-10-CM | POA: Diagnosis not present

## 2023-06-23 DIAGNOSIS — S9002XD Contusion of left ankle, subsequent encounter: Secondary | ICD-10-CM | POA: Diagnosis not present

## 2023-07-21 DIAGNOSIS — E1151 Type 2 diabetes mellitus with diabetic peripheral angiopathy without gangrene: Secondary | ICD-10-CM | POA: Diagnosis not present

## 2023-07-21 DIAGNOSIS — E039 Hypothyroidism, unspecified: Secondary | ICD-10-CM | POA: Diagnosis not present

## 2023-07-21 DIAGNOSIS — H401131 Primary open-angle glaucoma, bilateral, mild stage: Secondary | ICD-10-CM | POA: Diagnosis not present

## 2023-07-21 DIAGNOSIS — E1169 Type 2 diabetes mellitus with other specified complication: Secondary | ICD-10-CM | POA: Diagnosis not present

## 2023-08-21 DIAGNOSIS — H401131 Primary open-angle glaucoma, bilateral, mild stage: Secondary | ICD-10-CM | POA: Diagnosis not present

## 2023-08-21 DIAGNOSIS — E1169 Type 2 diabetes mellitus with other specified complication: Secondary | ICD-10-CM | POA: Diagnosis not present

## 2023-08-21 DIAGNOSIS — E1151 Type 2 diabetes mellitus with diabetic peripheral angiopathy without gangrene: Secondary | ICD-10-CM | POA: Diagnosis not present

## 2023-08-21 DIAGNOSIS — E039 Hypothyroidism, unspecified: Secondary | ICD-10-CM | POA: Diagnosis not present

## 2023-09-16 DIAGNOSIS — R001 Bradycardia, unspecified: Secondary | ICD-10-CM | POA: Diagnosis not present

## 2023-09-16 DIAGNOSIS — E782 Mixed hyperlipidemia: Secondary | ICD-10-CM | POA: Diagnosis not present

## 2023-09-16 DIAGNOSIS — I10 Essential (primary) hypertension: Secondary | ICD-10-CM | POA: Diagnosis not present

## 2023-09-16 DIAGNOSIS — I739 Peripheral vascular disease, unspecified: Secondary | ICD-10-CM | POA: Diagnosis not present

## 2023-09-16 DIAGNOSIS — E1149 Type 2 diabetes mellitus with other diabetic neurological complication: Secondary | ICD-10-CM | POA: Diagnosis not present

## 2023-09-16 DIAGNOSIS — E039 Hypothyroidism, unspecified: Secondary | ICD-10-CM | POA: Diagnosis not present

## 2023-09-16 DIAGNOSIS — R011 Cardiac murmur, unspecified: Secondary | ICD-10-CM | POA: Diagnosis not present

## 2023-09-21 DIAGNOSIS — E1151 Type 2 diabetes mellitus with diabetic peripheral angiopathy without gangrene: Secondary | ICD-10-CM | POA: Diagnosis not present

## 2023-09-21 DIAGNOSIS — E1169 Type 2 diabetes mellitus with other specified complication: Secondary | ICD-10-CM | POA: Diagnosis not present

## 2023-09-21 DIAGNOSIS — E039 Hypothyroidism, unspecified: Secondary | ICD-10-CM | POA: Diagnosis not present

## 2023-09-21 DIAGNOSIS — H401131 Primary open-angle glaucoma, bilateral, mild stage: Secondary | ICD-10-CM | POA: Diagnosis not present

## 2023-09-30 NOTE — Progress Notes (Unsigned)
 Cardiology Office Note    Date:  09/30/2023  ID:  KESLER WICKHAM, DOB October 04, 1935, MRN [id number] PCP:  Katina Pfeiffer, PA-C  Cardiologist:  Wilbert Bihari, MD  Electrophysiologist:  None   Chief Complaint: ***  History of Present Illness: .    ALIE HARDGROVE is a 88 y.o. male with visit-pertinent history of thrombocytopenia, DM, HTN, GERD, PAD and chronic venous insufficiency (managed conservatively by Dr. Sheree), sinus bradycardia, trifascicular block.  Patient with history of chronic right bundle branch block.  Patient was admitted in 10/2018 with presyncope describes multiple episodes of transient dizzy spells.  Heart rate was in the high 30s in the ER.  His atenolol was discontinued.  Patient's heart rate remained stable throughout admission without recurrent presyncope.  Echo at that time showed EF 55 to 60%, G2 DD, trace MR, mild TR.  30-day cardiac monitor was placed, this indicated sinus bradycardia, sinus arrhythmia, and NSR/sinus tach with average HR 76bpm and range 43-145bpm. There were occasional PACs/PVCs as well as brief sinus pauses with ventricular escape during the night. There is report of one of the strips that it represented atrial fib but episode was very brief and poor quality tracing, therefore not felt clinically significant.  Patient was previously started on CPAP therapy by Dr. Bihari.  Patient was last seen in clinic on 02/07/2023.  Patient reported he was doing well.  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  Today patient presents regarding     Bradycardia/trifascicular block:  HTN:  OSA:    Labwork independently reviewed:   ROS: .   *** denies chest pain, shortness of breath, lower extremity edema, fatigue, palpitations, melena, hematuria, hemoptysis, diaphoresis, weakness, presyncope, syncope, orthopnea, and PND.  All other systems are reviewed and otherwise negative.  Studies Reviewed: SABRA    EKG:  EKG is  ordered today, personally reviewed, demonstrating ***     CV Studies: Cardiac studies reviewed are outlined and summarized above. Otherwise please see EMR for full report. Cardiac Studies & Procedures   ______________________________________________________________________________________________     ECHOCARDIOGRAM  ECHOCARDIOGRAM COMPLETE 11/04/2018  Narrative ECHOCARDIOGRAM REPORT    Patient Name:   WESLIE PRETLOW Date of Exam: 11/04/2018 Medical Rec #:  [id number]       Height:       68.0 in Accession #:    [id number]      Weight:       172.2 lb Date of Birth:  08/28/1935       BSA:          1.92 m Patient Age:    83 years        BP:           131/57 mmHg Patient Gender: M               HR:           56 bpm. Exam Location:  Inpatient  Procedure: 2D Echo, Cardiac Doppler and Color Doppler  Indications:    Bradycardia  History:        Patient has no prior history of Echocardiogram examinations. PAD Signs/Symptoms:Presyncope Risk Factors:Hypertension and Diabetes.  Sonographer:    Lyle Marc Referring Phys: [id number] BRIDGETTE CHRISTOPHER  IMPRESSIONS   1. Left ventricular ejection fraction, by visual estimation, is 55 to 60%. The left ventricle has normal function. Left ventricular septal wall thickness was normal. Normal left ventricular posterior wall thickness. There is no left ventricular  hypertrophy. 2. Left ventricular diastolic Doppler parameters are consistent with pseudonormalization pattern of LV diastolic filling. 3. Global right ventricle has normal systolic function.The right ventricular size is normal. No increase in right ventricular wall thickness. 4. Left atrial size was normal. 5. Right atrial size was normal. 6. The mitral valve is normal in structure. Trace mitral valve regurgitation. 7. The tricuspid valve is normal in structure. Tricuspid valve regurgitation is mild. 8. The aortic valve is tricuspid Aortic valve regurgitation was not  visualized by color flow Doppler. 9. The pulmonic valve was grossly normal. Pulmonic valve regurgitation is trivial by color flow Doppler. 10. Normal pulmonary artery systolic pressure. 11. The inferior vena cava is normal in size with greater than 50% respiratory variability, suggesting right atrial pressure of 3 mmHg.  FINDINGS Left Ventricle: Left ventricular ejection fraction, by visual estimation, is 55 to 60%. The left ventricle has normal function. No evidence of left ventricular regional wall motion abnormalities. Left ventricular septal wall thickness was normal. Normal left ventricular posterior wall thickness. There is no left ventricular hypertrophy. Spectral Doppler shows Left ventricular diastolic Doppler parameters are consistent with pseudonormalization pattern of LV diastolic filling.  Right Ventricle: The right ventricular size is normal. No increase in right ventricular wall thickness. Global RV systolic function is has normal systolic function. The tricuspid regurgitant velocity is 2.28 m/s, and with an assumed right atrial pressure of 3 mmHg, the estimated right ventricular systolic pressure is normal at 23.9 mmHg.  Left Atrium: Left atrial size was normal in size.  Right Atrium: Right atrial size was normal in size  Pericardium: There is no evidence of pericardial effusion.  Mitral Valve: The mitral valve is normal in structure. Trace mitral valve regurgitation.  Tricuspid Valve: The tricuspid valve is normal in structure. Tricuspid valve regurgitation is mild by color flow Doppler.  Aortic Valve: The aortic valve is tricuspid. Aortic valve regurgitation was not visualized by color flow Doppler.  Pulmonic Valve: The pulmonic valve was grossly normal. Pulmonic valve regurgitation is trivial by color flow Doppler.  Aorta: The aortic root, ascending aorta and aortic arch are all structurally normal, with no evidence of dilitation or obstruction.  Pulmonary Artery: The  pulmonary artery is not well seen.  Venous: The inferior vena cava is normal in size with greater than 50% respiratory variability, suggesting right atrial pressure of 3 mmHg.  IAS/Shunts: No atrial level shunt detected by color flow Doppler.    LEFT VENTRICLE PLAX 2D LVIDd:         4.50 cm  Diastology LVIDs:         3.10 cm  LV e' lateral:   7.34 cm/s LV PW:         1.00 cm  LV E/e' lateral: 11.1 LV IVS:        1.00 cm  LV e' medial:    7.29 cm/s LVOT diam:     1.80 cm  LV E/e' medial:  11.2 LV SV:         55 ml LV SV Index:   27.98 LVOT Area:     2.54 cm   RIGHT VENTRICLE RV Basal diam:  3.40 cm RV S prime:     13.10 cm/s TAPSE (M-mode): 2.6 cm  LEFT ATRIUM             Index       RIGHT ATRIUM           Index LA diam:  3.90 cm 2.03 cm/m  RA Area:     15.60 cm LA Vol (A2C):   50.3 ml 26.23 ml/m RA Volume:   40.60 ml  21.17 ml/m LA Vol (A4C):   38.1 ml 19.87 ml/m LA Biplane Vol: 46.3 ml 24.14 ml/m AORTIC VALVE LVOT Vmax:   127.00 cm/s LVOT Vmean:  81.100 cm/s LVOT VTI:    0.308 m  AORTA Ao Root diam: 2.80 cm  MITRAL VALVE                        TRICUSPID VALVE MV Area (PHT): 3.48 cm             TR Peak grad:   20.9 mmHg MV PHT:        63.22 msec           TR Vmax:        251.00 cm/s MV Decel Time: 218 msec MV E velocity: 81.40 cm/s 103 cm/s  SHUNTS MV A velocity: 96.00 cm/s 70.3 cm/s Systemic VTI:  0.31 m MV E/A ratio:  0.85       1.5       Systemic Diam: 1.80 cm   Shelda Bruckner MD Electronically signed by Shelda Bruckner MD Signature Date/Time: 11/05/2018/12:06:29 PM    Final    MONITORS  CARDIAC EVENT MONITOR 12/28/2018  Narrative  Sinus bradycardia, sinus arrythmia, normal sinus rhythm and sinus tachycardia with average heart rate 76bpm and ranged from 43 to 145bpm.  Occasional PVC and PACs  Sinus pauses with ventricular escape during the night  Cannot rule out brief episode of paroxysmal atrial fibrillation but tracing  is poor quality       ______________________________________________________________________________________________       Current Reported Medications:.    No outpatient medications have been marked as taking for the 10/01/23 encounter (Appointment) with Judi Jaffe D, NP.    Physical Exam:    VS:  There were no vitals taken for this visit.   Wt Readings from Last 3 Encounters:  02/07/23 173 lb (78.5 kg)  01/09/22 172 lb 12.8 oz (78.4 kg)  08/11/20 175 lb (79.4 kg)    GEN: Well nourished, well developed in no acute distress NECK: No JVD; No carotid bruits CARDIAC: ***RRR, no murmurs, rubs, gallops RESPIRATORY:  Clear to auscultation without rales, wheezing or rhonchi  ABDOMEN: Soft, non-tender, non-distended EXTREMITIES:  No edema; No acute deformity     Asessement and Plan:.     ***     Disposition: F/u with ***  Signed, Jaydy Fitzhenry D Josselyne Onofrio, NP

## 2023-10-01 ENCOUNTER — Encounter: Payer: Self-pay | Admitting: Cardiology

## 2023-10-01 ENCOUNTER — Ambulatory Visit: Attending: Cardiology | Admitting: Cardiology

## 2023-10-01 VITALS — BP 128/72 | HR 62 | Ht 68.0 in | Wt 169.0 lb

## 2023-10-01 DIAGNOSIS — R011 Cardiac murmur, unspecified: Secondary | ICD-10-CM | POA: Diagnosis not present

## 2023-10-01 DIAGNOSIS — I452 Bifascicular block: Secondary | ICD-10-CM

## 2023-10-01 DIAGNOSIS — I10 Essential (primary) hypertension: Secondary | ICD-10-CM

## 2023-10-01 DIAGNOSIS — G4733 Obstructive sleep apnea (adult) (pediatric): Secondary | ICD-10-CM | POA: Diagnosis not present

## 2023-10-01 NOTE — Patient Instructions (Signed)
 Medication Instructions:  Your physician recommends that you continue on your current medications as directed. Please refer to the Current Medication list given to you today.  *If you need a refill on your cardiac medications before your next appointment, please call your pharmacy*  Lab Work: None ordered  If you have labs (blood work) drawn today and your tests are completely normal, you will receive your results only by: MyChart Message (if you have MyChart) OR A paper copy in the mail If you have any lab test that is abnormal or we need to change your treatment, we will call you to review the results.  Testing/Procedures: None ordered  Follow-Up: At [facility], you and your health needs are our priority.  As part of our continuing mission to provide you with exceptional heart care, our providers are all part of one team.  This team includes your primary Cardiologist (physician) and Advanced Practice Providers or APPs (Physician Assistants and Nurse Practitioners) who all work together to provide you with the care you need, when you need it.  Your next appointment:   4 week(s)  Provider:   Katlyn West, NP          We recommend signing up for the patient portal called MyChart.  Sign up information is provided on this After Visit Summary.  MyChart is used to connect with patients for Virtual Visits (Telemedicine).  Patients are able to view lab/test results, encounter notes, upcoming appointments, etc.  Non-urgent messages can be sent to your provider as well.   To learn more about what you can do with MyChart, go to [email or web address].   Other Instructions

## 2023-10-15 DIAGNOSIS — R011 Cardiac murmur, unspecified: Secondary | ICD-10-CM | POA: Diagnosis not present

## 2023-10-21 DIAGNOSIS — E039 Hypothyroidism, unspecified: Secondary | ICD-10-CM | POA: Diagnosis not present

## 2023-10-21 DIAGNOSIS — H401131 Primary open-angle glaucoma, bilateral, mild stage: Secondary | ICD-10-CM | POA: Diagnosis not present

## 2023-10-21 DIAGNOSIS — E1169 Type 2 diabetes mellitus with other specified complication: Secondary | ICD-10-CM | POA: Diagnosis not present

## 2023-10-21 DIAGNOSIS — E1151 Type 2 diabetes mellitus with diabetic peripheral angiopathy without gangrene: Secondary | ICD-10-CM | POA: Diagnosis not present

## 2023-10-29 NOTE — Progress Notes (Deleted)
 Cardiology Office Note    Date:  10/29/2023  ID:  Kevin [name], DOB 11/22/1935, MRN [id number] PCP:  Katina Pfeiffer, PA-C  Cardiologist:  Wilbert Bihari, MD  Electrophysiologist:  None   Chief Complaint: ***  History of Present Illness: .    Kevin [name] is a 88 y.o. male with visit-pertinent history of thrombocytopenia, DM, HTN, GERD, PAD and chronic venous insufficiency (managed conservatively by Dr. Sheree), sinus bradycardia, trifascicular block.   Patient with history of chronic right bundle branch block.  Patient was admitted in 10/2018 with presyncope describes multiple episodes of transient dizzy spells.  Heart rate was in the high 30s in the ER.  His atenolol was discontinued.  Patient's heart rate remained stable throughout admission without recurrent presyncope.  Echo at that time showed EF 55 to 60%, G2 DD, trace MR, mild TR.  30-day cardiac monitor was placed, this indicated sinus bradycardia, sinus arrhythmia, and NSR/sinus tach with average HR 76bpm and range 43-145bpm. There were occasional PACs/PVCs as well as brief sinus pauses with ventricular escape during the night. There is report of one of the strips that it represented atrial fib but episode was very brief and poor quality tracing, therefore not felt clinically significant.  Patient was previously started on CPAP therapy by Dr. Bihari.   Patient was seen in clinic on 02/07/2023.  Patient reported he was doing well.  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   Patient was last seen in clinic on 10/01/2023 regarding a new systolic mark murmur noted by his PCP.  Patient reported that his home health aide had notified him a few weeks prior that he had heard a cardiac murmur on follow-up with PCP and noted to have systolic murmur.  Echocardiogram had been ordered by his PCP however had not yet been completed.  Patient noted some very mild lower extremity edema that was resolved with  wearing of compression stockings daily.  Patient denied any dizziness, lightheadedness or presyncope.  He reported that he was continue with normal activity and doing very well.   Labwork independently reviewed:   ROS: .   *** denies chest pain, shortness of breath, lower extremity edema, fatigue, palpitations, melena, hematuria, hemoptysis, diaphoresis, weakness, presyncope, syncope, orthopnea, and PND.  All other systems are reviewed and otherwise negative.  Studies Reviewed: SABRA    EKG:  EKG is ordered today, personally reviewed, demonstrating ***     CV Studies: Cardiac studies reviewed are outlined and summarized above. Otherwise please see EMR for full report. Cardiac Studies & Procedures   ______________________________________________________________________________________________     ECHOCARDIOGRAM  ECHOCARDIOGRAM COMPLETE 11/04/2018  Narrative ECHOCARDIOGRAM REPORT    Patient Name:   LAYDON MARTIS Date of Exam: 11/04/2018 Medical Rec #:  [id number]       Height:       68.0 in Accession #:    [id number]      Weight:       172.2 lb Date of Birth:  [birth year]       BSA:          1.92 m Patient Age:    83 years        BP:           131/57 mmHg Patient Gender: M               HR:           56 bpm. Exam Location:  Inpatient  Procedure: 2D Echo, Cardiac Doppler and Color Doppler  Indications:    Bradycardia  History:        Patient has no prior history of Echocardiogram examinations. PAD Signs/Symptoms:Presyncope Risk Factors:Hypertension and Diabetes.  Sonographer:    Lyle Marc Referring Phys: [id number] BRIDGETTE CHRISTOPHER  IMPRESSIONS   1. Left ventricular ejection fraction, by visual estimation, is 55 to 60%. The left ventricle has normal function. Left ventricular septal wall thickness was normal. Normal left ventricular posterior wall thickness. There is no left ventricular hypertrophy. 2. Left ventricular diastolic Doppler parameters are  consistent with pseudonormalization pattern of LV diastolic filling. 3. Global right ventricle has normal systolic function.The right ventricular size is normal. No increase in right ventricular wall thickness. 4. Left atrial size was normal. 5. Right atrial size was normal. 6. The mitral valve is normal in structure. Trace mitral valve regurgitation. 7. The tricuspid valve is normal in structure. Tricuspid valve regurgitation is mild. 8. The aortic valve is tricuspid Aortic valve regurgitation was not visualized by color flow Doppler. 9. The pulmonic valve was grossly normal. Pulmonic valve regurgitation is trivial by color flow Doppler. 10. Normal pulmonary artery systolic pressure. 11. The inferior vena cava is normal in size with greater than 50% respiratory variability, suggesting right atrial pressure of 3 mmHg.  FINDINGS Left Ventricle: Left ventricular ejection fraction, by visual estimation, is 55 to 60%. The left ventricle has normal function. No evidence of left ventricular regional wall motion abnormalities. Left ventricular septal wall thickness was normal. Normal left ventricular posterior wall thickness. There is no left ventricular hypertrophy. Spectral Doppler shows Left ventricular diastolic Doppler parameters are consistent with pseudonormalization pattern of LV diastolic filling.  Right Ventricle: The right ventricular size is normal. No increase in right ventricular wall thickness. Global RV systolic function is has normal systolic function. The tricuspid regurgitant velocity is 2.28 m/s, and with an assumed right atrial pressure of 3 mmHg, the estimated right ventricular systolic pressure is normal at 23.9 mmHg.  Left Atrium: Left atrial size was normal in size.  Right Atrium: Right atrial size was normal in size  Pericardium: There is no evidence of pericardial effusion.  Mitral Valve: The mitral valve is normal in structure. Trace mitral valve  regurgitation.  Tricuspid Valve: The tricuspid valve is normal in structure. Tricuspid valve regurgitation is mild by color flow Doppler.  Aortic Valve: The aortic valve is tricuspid. Aortic valve regurgitation was not visualized by color flow Doppler.  Pulmonic Valve: The pulmonic valve was grossly normal. Pulmonic valve regurgitation is trivial by color flow Doppler.  Aorta: The aortic root, ascending aorta and aortic arch are all structurally normal, with no evidence of dilitation or obstruction.  Pulmonary Artery: The pulmonary artery is not well seen.  Venous: The inferior vena cava is normal in size with greater than 50% respiratory variability, suggesting right atrial pressure of 3 mmHg.  IAS/Shunts: No atrial level shunt detected by color flow Doppler.    LEFT VENTRICLE PLAX 2D LVIDd:         4.50 cm  Diastology LVIDs:         3.10 cm  LV e' lateral:   7.34 cm/s LV PW:         1.00 cm  LV E/e' lateral: 11.1 LV IVS:        1.00 cm  LV e' medial:    7.29 cm/s LVOT diam:     1.80 cm  LV E/e' medial:  11.2 LV SV:  55 ml LV SV Index:   27.98 LVOT Area:     2.54 cm   RIGHT VENTRICLE RV Basal diam:  3.40 cm RV S prime:     13.10 cm/s TAPSE (M-mode): 2.6 cm  LEFT ATRIUM             Index       RIGHT ATRIUM           Index LA diam:        3.90 cm 2.03 cm/m  RA Area:     15.60 cm LA Vol (A2C):   50.3 ml 26.23 ml/m RA Volume:   40.60 ml  21.17 ml/m LA Vol (A4C):   38.1 ml 19.87 ml/m LA Biplane Vol: 46.3 ml 24.14 ml/m AORTIC VALVE LVOT Vmax:   127.00 cm/s LVOT Vmean:  81.100 cm/s LVOT VTI:    0.308 m  AORTA Ao Root diam: 2.80 cm  MITRAL VALVE                        TRICUSPID VALVE MV Area (PHT): 3.48 cm             TR Peak grad:   20.9 mmHg MV PHT:        63.22 msec           TR Vmax:        251.00 cm/s MV Decel Time: 218 msec MV E velocity: 81.40 cm/s 103 cm/s  SHUNTS MV A velocity: 96.00 cm/s 70.3 cm/s Systemic VTI:  0.31 m MV E/A ratio:  0.85        1.5       Systemic Diam: 1.80 cm   Shelda Bruckner MD Electronically signed by Shelda Bruckner MD Signature Date/Time: 11/05/2018/12:06:29 PM    Final    MONITORS  CARDIAC EVENT MONITOR 12/28/2018  Narrative  Sinus bradycardia, sinus arrythmia, normal sinus rhythm and sinus tachycardia with average heart rate 76bpm and ranged from 43 to 145bpm.  Occasional PVC and PACs  Sinus pauses with ventricular escape during the night  Cannot rule out brief episode of paroxysmal atrial fibrillation but tracing is poor quality       ______________________________________________________________________________________________       Current Reported Medications:.    No outpatient medications have been marked as taking for the 10/30/23 encounter (Appointment) with Yvon Mccord D, NP.    Physical Exam:    VS:  There were no vitals taken for this visit.   Wt Readings from Last 3 Encounters:  10/01/23 169 lb (76.7 kg)  02/07/23 173 lb (78.5 kg)  01/09/22 172 lb 12.8 oz (78.4 kg)    GEN: Well nourished, well developed in no acute distress NECK: No JVD; No carotid bruits CARDIAC: ***RRR, no murmurs, rubs, gallops RESPIRATORY:  Clear to auscultation without rales, wheezing or rhonchi  ABDOMEN: Soft, non-tender, non-distended EXTREMITIES:  No edema; No acute deformity     Asessement and Plan:.     ***     Disposition: F/u with ***  Signed, Mirabella Hilario D Valleri Hendricksen, NP

## 2023-10-30 ENCOUNTER — Ambulatory Visit: Admitting: Cardiology

## 2023-10-30 ENCOUNTER — Telehealth: Payer: Self-pay | Admitting: Cardiology

## 2023-10-30 NOTE — Telephone Encounter (Signed)
 Called patient this morning regarding echocardiogram as full results had not yet been received from his PCPs office however I was able to see a note from his PCP that indicated his echo was normal.  Patient reported that he was doing very well and had no cardiac concerns or complaints at this time.  Through shared decision making elected to cancel his appointment this morning, will request echocardiogram from his PCPs office for my review.  Patient will notify the office if he has any new or concerning symptoms, we will cancel his appointment for today.

## 2023-10-30 NOTE — Telephone Encounter (Signed)
 Appt canceled

## 2023-11-05 DIAGNOSIS — E782 Mixed hyperlipidemia: Secondary | ICD-10-CM | POA: Diagnosis not present

## 2023-11-05 DIAGNOSIS — E1149 Type 2 diabetes mellitus with other diabetic neurological complication: Secondary | ICD-10-CM | POA: Diagnosis not present

## 2023-11-05 DIAGNOSIS — I739 Peripheral vascular disease, unspecified: Secondary | ICD-10-CM | POA: Diagnosis not present

## 2023-11-05 DIAGNOSIS — R011 Cardiac murmur, unspecified: Secondary | ICD-10-CM | POA: Diagnosis not present

## 2023-11-05 DIAGNOSIS — E039 Hypothyroidism, unspecified: Secondary | ICD-10-CM | POA: Diagnosis not present
